# Patient Record
Sex: Male | Born: 1960 | Race: White | Hispanic: No | Smoking: Current every day smoker
Health system: Southern US, Community
[De-identification: ages and names within clinical notes are randomized; demographics above are authoritative.]

---

## 2005-03-27 ENCOUNTER — Emergency Department (HOSPITAL_COMMUNITY): Admission: EM | Admit: 2005-03-27 | Discharge: 2005-03-27 | Payer: Self-pay | Admitting: Emergency Medicine

## 2005-09-12 ENCOUNTER — Emergency Department (HOSPITAL_COMMUNITY): Admission: EM | Admit: 2005-09-12 | Discharge: 2005-09-12 | Payer: Self-pay | Admitting: Emergency Medicine

## 2005-09-14 ENCOUNTER — Emergency Department (HOSPITAL_COMMUNITY): Admission: EM | Admit: 2005-09-14 | Discharge: 2005-09-14 | Payer: Self-pay | Admitting: Emergency Medicine

## 2006-04-21 ENCOUNTER — Emergency Department (HOSPITAL_COMMUNITY): Admission: EM | Admit: 2006-04-21 | Discharge: 2006-04-22 | Payer: Self-pay | Admitting: Emergency Medicine

## 2007-09-11 ENCOUNTER — Emergency Department (HOSPITAL_COMMUNITY): Admission: EM | Admit: 2007-09-11 | Discharge: 2007-09-11 | Payer: Self-pay | Admitting: Emergency Medicine

## 2007-09-16 ENCOUNTER — Emergency Department (HOSPITAL_COMMUNITY): Admission: EM | Admit: 2007-09-16 | Discharge: 2007-09-16 | Payer: Self-pay | Admitting: Emergency Medicine

## 2007-10-07 ENCOUNTER — Emergency Department (HOSPITAL_COMMUNITY): Admission: EM | Admit: 2007-10-07 | Discharge: 2007-10-07 | Payer: Self-pay | Admitting: Emergency Medicine

## 2008-01-16 ENCOUNTER — Emergency Department (HOSPITAL_COMMUNITY): Admission: EM | Admit: 2008-01-16 | Discharge: 2008-01-16 | Payer: Self-pay | Admitting: Emergency Medicine

## 2008-02-27 ENCOUNTER — Emergency Department (HOSPITAL_COMMUNITY): Admission: EM | Admit: 2008-02-27 | Discharge: 2008-02-27 | Payer: Self-pay | Admitting: Emergency Medicine

## 2008-03-04 ENCOUNTER — Emergency Department (HOSPITAL_COMMUNITY): Admission: EM | Admit: 2008-03-04 | Discharge: 2008-03-04 | Payer: Self-pay | Admitting: Emergency Medicine

## 2008-06-09 IMAGING — CR DG SCAPULA*L*
3 series · 3 of 3 positions shown · non-contrast
Comparison: None.

CLINICAL DATA: Blunt trauma ? left shoulder pain.
 LEFT SHOULDER ? 3 VIEW:

[w scapula ap/pa left (1 of 2)]
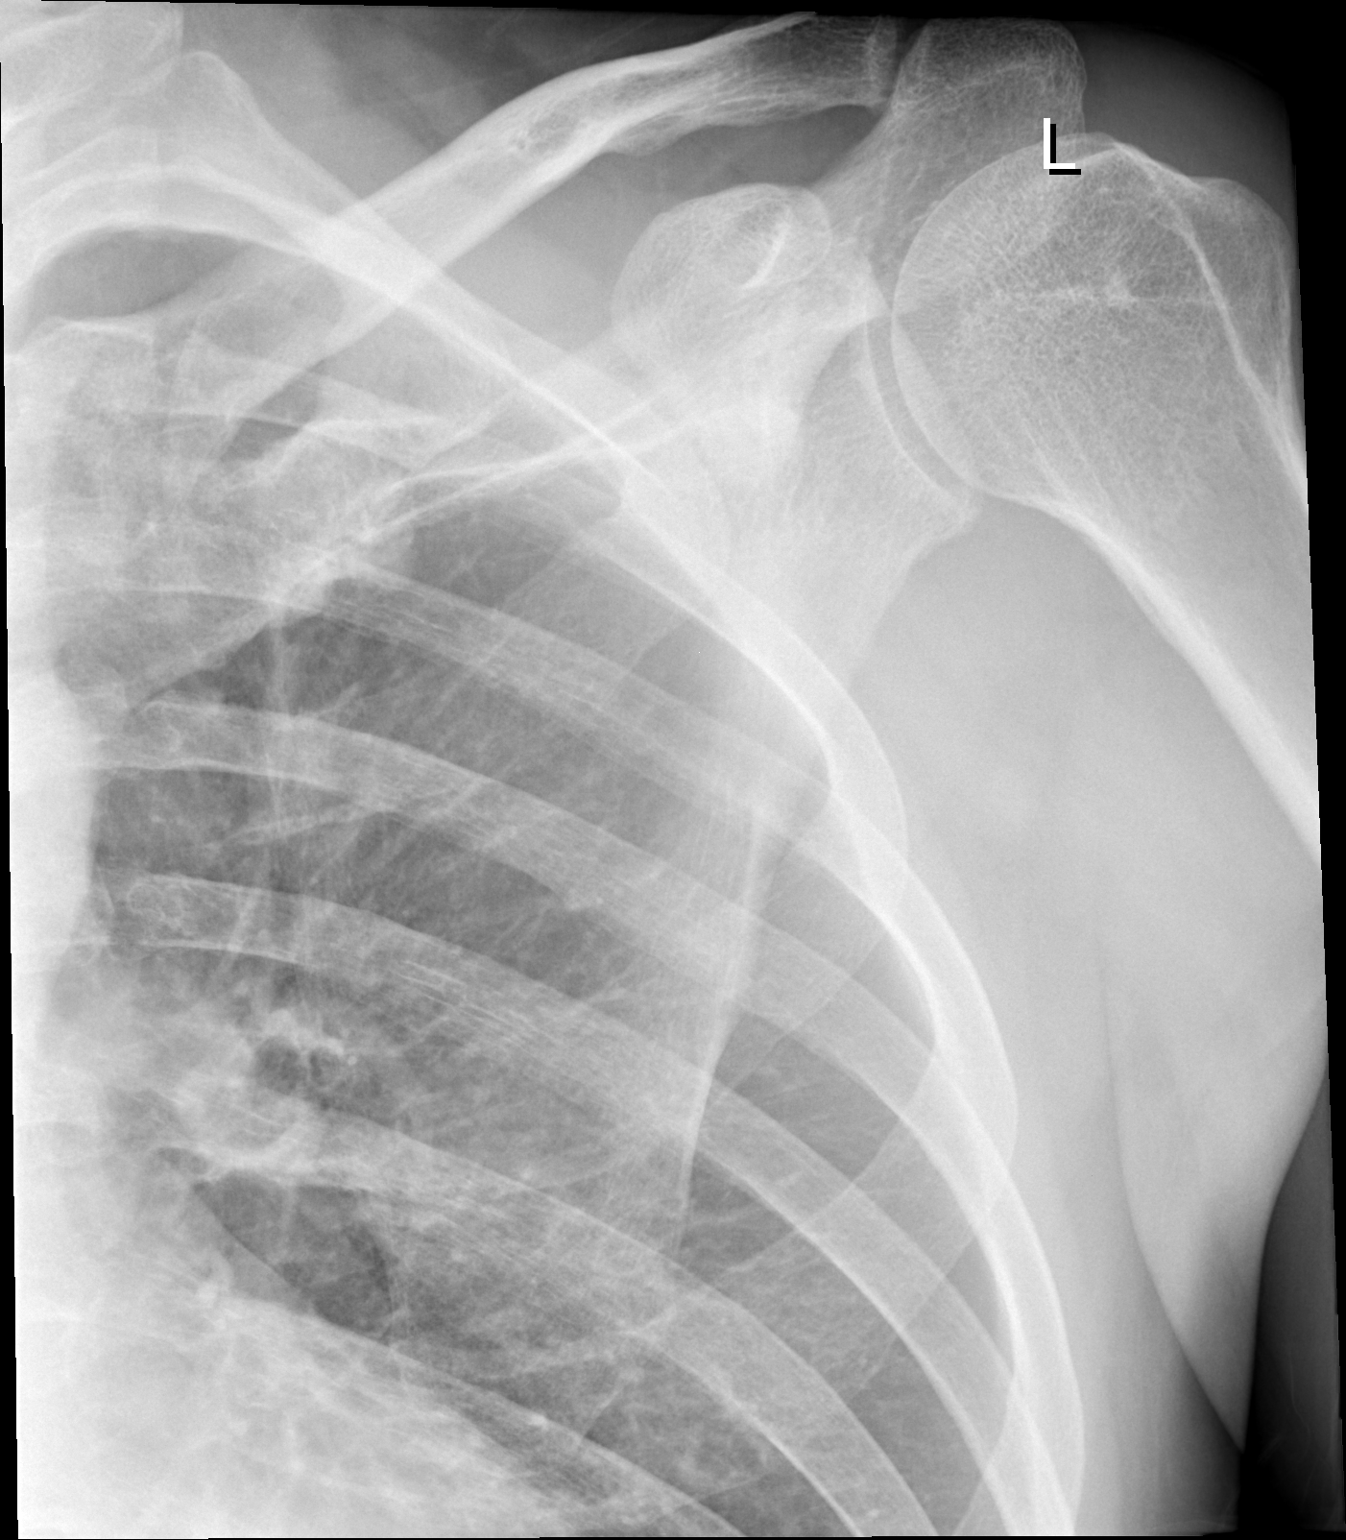

[w scapula ap/pa left (2 of 2)]
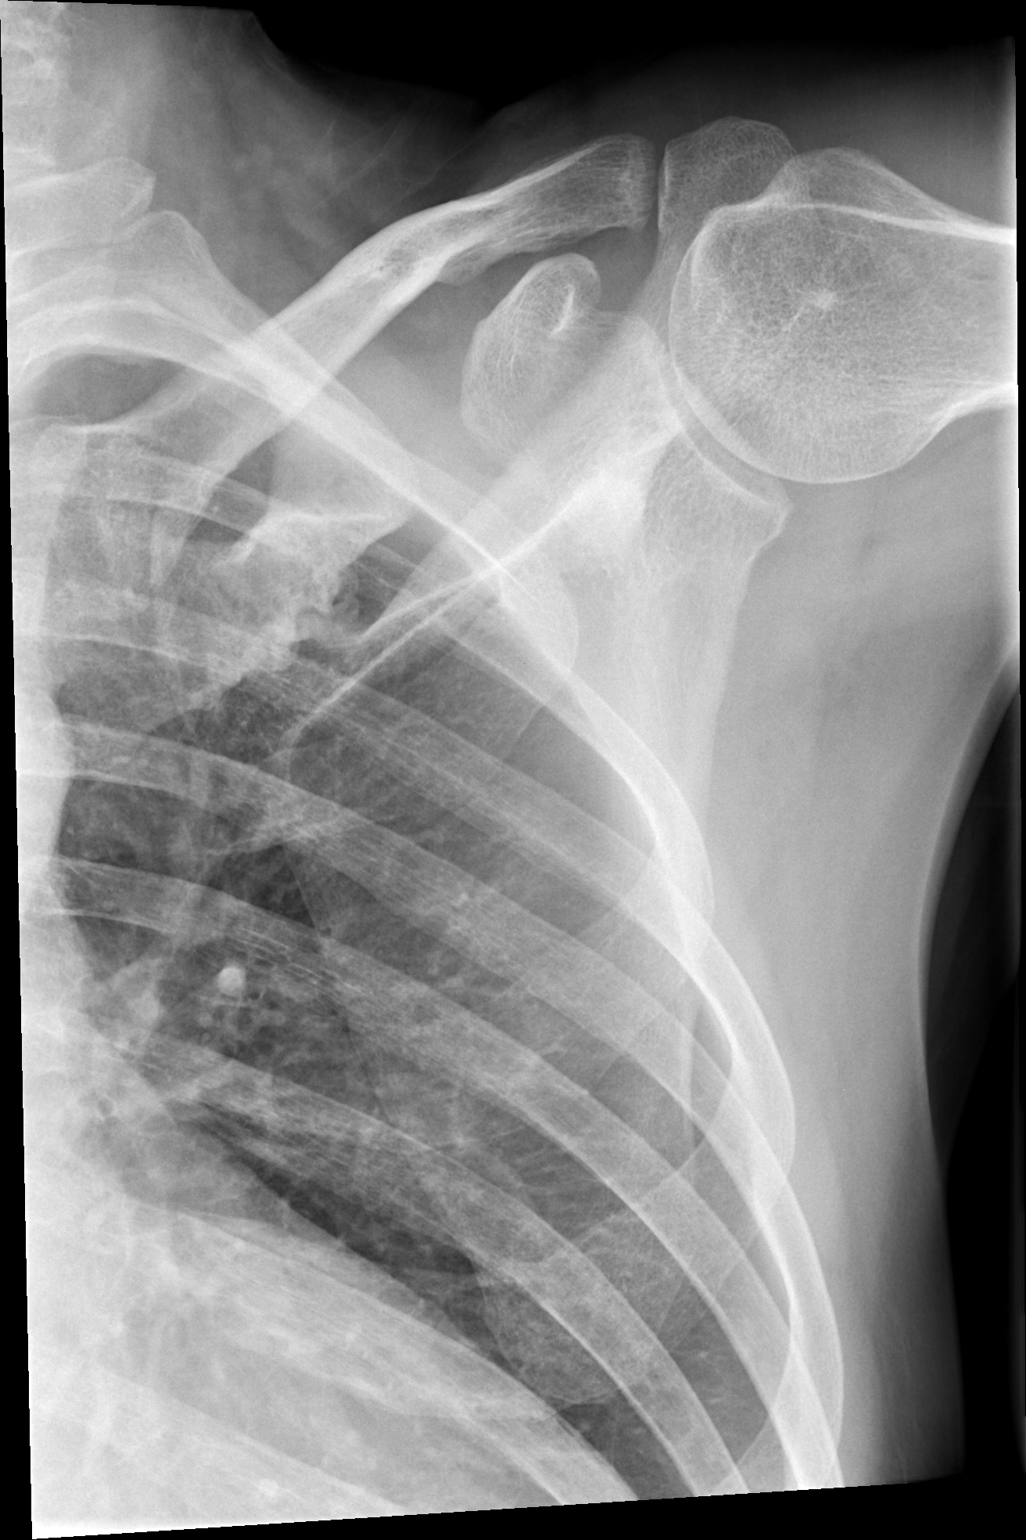

[w scapula lat left *]
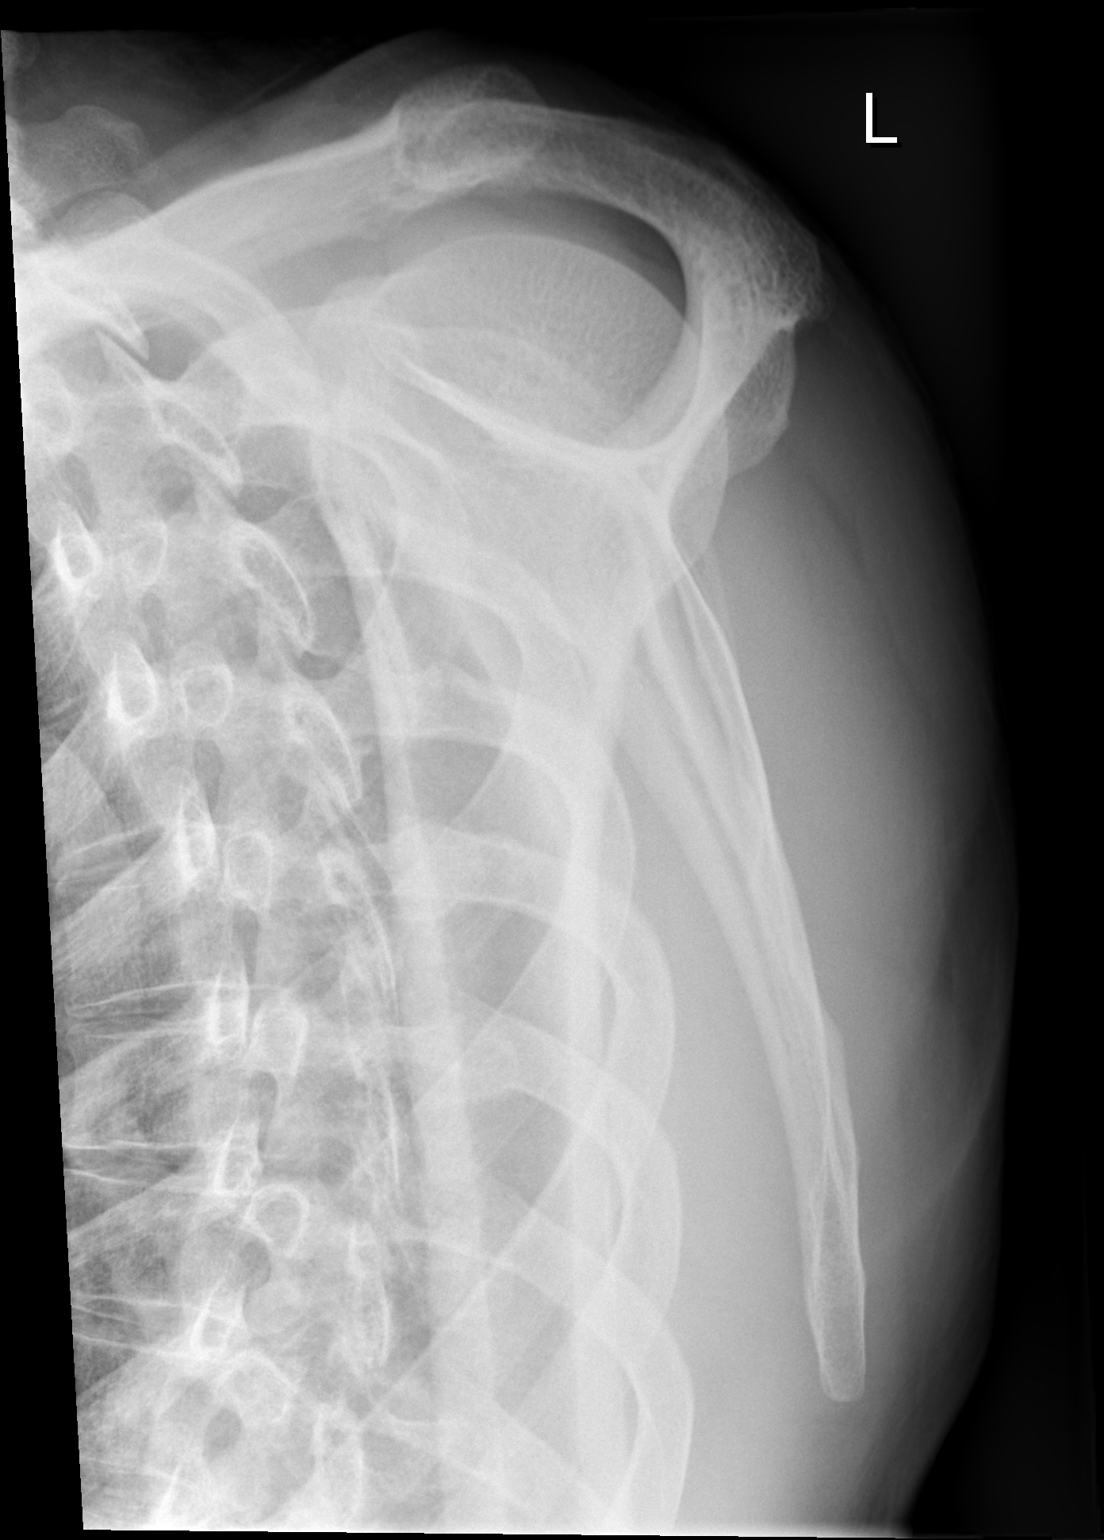

[3 of 3 positions shown; findings below may reference images not displayed]

FINDINGS: No acute radiographic abnormality.  There are mild degenerative changes of the AC joint.    Soft tissues unremarkable.
IMPRESSION: No acute findings.

## 2011-05-12 LAB — URINALYSIS, ROUTINE W REFLEX MICROSCOPIC
Bilirubin Urine: NEGATIVE
Nitrite: NEGATIVE
Specific Gravity, Urine: 1.012
Urobilinogen, UA: 0.2

## 2011-05-12 LAB — URINE MICROSCOPIC-ADD ON

## 2021-05-31 ENCOUNTER — Other Ambulatory Visit: Payer: Self-pay

## 2021-05-31 ENCOUNTER — Emergency Department (HOSPITAL_COMMUNITY)
Admission: EM | Admit: 2021-05-31 | Discharge: 2021-06-02 | Disposition: A | Discharge status: Home / Self Care | Payer: Self-pay | Attending: Emergency Medicine | Admitting: Emergency Medicine

## 2021-05-31 DIAGNOSIS — Z20822 Contact with and (suspected) exposure to covid-19: Secondary | ICD-10-CM | POA: Insufficient documentation

## 2021-05-31 DIAGNOSIS — F10229 Alcohol dependence with intoxication, unspecified: Secondary | ICD-10-CM | POA: Insufficient documentation

## 2021-05-31 DIAGNOSIS — F1914 Other psychoactive substance abuse with psychoactive substance-induced mood disorder: Secondary | ICD-10-CM | POA: Insufficient documentation

## 2021-05-31 DIAGNOSIS — F10230 Alcohol dependence with withdrawal, uncomplicated: Secondary | ICD-10-CM

## 2021-05-31 DIAGNOSIS — F1910 Other psychoactive substance abuse, uncomplicated: Secondary | ICD-10-CM

## 2021-05-31 DIAGNOSIS — R45851 Suicidal ideations: Secondary | ICD-10-CM | POA: Insufficient documentation

## 2021-05-31 DIAGNOSIS — F1994 Other psychoactive substance use, unspecified with psychoactive substance-induced mood disorder: Secondary | ICD-10-CM

## 2021-05-31 LAB — CBC WITH DIFFERENTIAL/PLATELET
Abs Immature Granulocytes: 0.03 10*3/uL (ref 0.00–0.07)
Basophils Absolute: 0 10*3/uL (ref 0.0–0.1)
Basophils Relative: 0 %
Eosinophils Absolute: 0.2 10*3/uL (ref 0.0–0.5)
Eosinophils Relative: 2 %
HCT: 42 % (ref 39.0–52.0)
Hemoglobin: 14 g/dL (ref 13.0–17.0)
Immature Granulocytes: 0 %
Lymphocytes Relative: 13 %
Lymphs Abs: 1.2 10*3/uL (ref 0.7–4.0)
MCH: 30.8 pg (ref 26.0–34.0)
MCHC: 33.3 g/dL (ref 30.0–36.0)
MCV: 92.5 fL (ref 80.0–100.0)
Monocytes Absolute: 0.5 10*3/uL (ref 0.1–1.0)
Monocytes Relative: 5 %
Neutro Abs: 7.3 10*3/uL (ref 1.7–7.7)
Neutrophils Relative %: 80 %
Platelets: 226 10*3/uL (ref 150–400)
RBC: 4.54 MIL/uL (ref 4.22–5.81)
RDW: 11.7 % (ref 11.5–15.5)
WBC: 9.3 10*3/uL (ref 4.0–10.5)
nRBC: 0 % (ref 0.0–0.2)

## 2021-05-31 LAB — COMPREHENSIVE METABOLIC PANEL
ALT: 21 U/L (ref 0–44)
AST: 21 U/L (ref 15–41)
Albumin: 4.5 g/dL (ref 3.5–5.0)
Alkaline Phosphatase: 75 U/L (ref 38–126)
Anion gap: 10 (ref 5–15)
BUN: 14 mg/dL (ref 8–23)
CO2: 28 mmol/L (ref 22–32)
Calcium: 9.8 mg/dL (ref 8.9–10.3)
Chloride: 101 mmol/L (ref 98–111)
Creatinine, Ser: 1.07 mg/dL (ref 0.61–1.24)
GFR, Estimated: >60 mL/min (ref >60)
Glucose, Bld: 104 mg/dL — ABNORMAL HIGH (ref 70–99)
Potassium: 4.4 mmol/L (ref 3.5–5.1)
Sodium: 139 mmol/L (ref 135–145)
Total Bilirubin: 0.8 mg/dL (ref 0.3–1.2)
Total Protein: 7.6 g/dL (ref 6.5–8.1)

## 2021-05-31 LAB — ACETAMINOPHEN LEVEL: Acetaminophen (Tylenol), Serum: <10 ug/mL — ABNORMAL LOW (ref 10–30)

## 2021-05-31 LAB — ETHANOL: Alcohol, Ethyl (B): <10 mg/dL (ref <10)

## 2021-05-31 LAB — LIPASE, BLOOD: Lipase: 28 U/L (ref 11–51)

## 2021-05-31 LAB — RESP PANEL BY RT-PCR (FLU A&B, COVID) ARPGX2
Influenza A by PCR: NEGATIVE
Influenza B by PCR: NEGATIVE
SARS Coronavirus 2 by RT PCR: NEGATIVE

## 2021-05-31 LAB — RAPID URINE DRUG SCREEN, HOSP PERFORMED
Amphetamines: NOT DETECTED
Barbiturates: NOT DETECTED
Benzodiazepines: POSITIVE — AB
Cocaine: NOT DETECTED
Opiates: NOT DETECTED
Tetrahydrocannabinol: NOT DETECTED

## 2021-05-31 LAB — SALICYLATE LEVEL: Salicylate Lvl: <7 mg/dL — ABNORMAL LOW (ref 7.0–30.0)

## 2021-05-31 MED ORDER — THIAMINE HCL 100 MG/ML IJ SOLN: 100.0000 mg | Freq: Every day | INTRAMUSCULAR | Status: DC

## 2021-05-31 MED ORDER — LORAZEPAM 1 MG PO TABS: 0.0000 mg | ORAL_TABLET | Freq: Two times a day (BID) | ORAL | Status: DC

## 2021-05-31 MED ORDER — THIAMINE HCL 100 MG PO TABS
100.0000 mg | ORAL_TABLET | Freq: Every day | ORAL | Status: DC
  Administered 2021-05-31 – 2021-06-02 (×3): 100 mg via ORAL
  Filled 2021-05-31 (×2): qty 1

## 2021-05-31 MED ORDER — LORAZEPAM 1 MG PO TABS
0.0000 mg | ORAL_TABLET | Freq: Four times a day (QID) | ORAL | Status: DC
  Administered 2021-05-31 – 2021-06-01 (×3): 1 mg via ORAL
  Administered 2021-06-01: 2 mg via ORAL
  Administered 2021-06-02 (×2): 1 mg via ORAL
  Administered 2021-06-02: 2 mg via ORAL
  Filled 2021-05-31 (×2): qty 1
  Filled 2021-05-31: qty 2
  Filled 2021-05-31 (×2): qty 1
  Filled 2021-05-31 (×2): qty 2
  Filled 2021-05-31: qty 1

## 2021-05-31 MED ORDER — LORAZEPAM 1 MG PO TABS
2.0000 mg | ORAL_TABLET | Freq: Once | ORAL | Status: AC
  Administered 2021-05-31: 2 mg via ORAL
  Filled 2021-05-31: qty 2

## 2021-05-31 MED ORDER — ACETAMINOPHEN 500 MG PO TABS
500.0000 mg | ORAL_TABLET | Freq: Once | ORAL | Status: AC
  Administered 2021-05-31: 500 mg via ORAL
  Filled 2021-05-31: qty 1

## 2021-05-31 MED ORDER — LORAZEPAM 2 MG/ML IJ SOLN: 0.0000 mg | Freq: Two times a day (BID) | INTRAMUSCULAR | Status: DC

## 2021-05-31 MED ORDER — LORAZEPAM 2 MG/ML IJ SOLN: 0.0000 mg | Freq: Four times a day (QID) | INTRAMUSCULAR | Status: DC

## 2021-05-31 NOTE — ED Triage Notes (Signed)
PT c/o withdrawal from ETOH. PT reports last drink was Sunday 10/9. Pt was drinking a fifth a day. Pt requesting to detox.

## 2021-05-31 NOTE — ED Provider Notes (Signed)
Emergency Medicine Provider Triage Evaluation Note  Doye Montilla , a 60 y.o. male  was evaluated in triage.  Pt complains of si and etoh withdrawal. Last drink was 2 days ago. He also uses valium and used that 2 days ago. Endorses si and states he wanted to drive into a bridge pta but his gf convinced him to come her instead. States he feels worthless and has no will to live. Denies prior hx of etoh withdrawal seizures  Review of Systems  Positive: Si, tremors, headaches, nv Negative: Chest pain  Physical Exam  BP (!) 176/106 (BP Location: Left Arm)   Pulse 93   Temp 98.6 F (37 C) (Oral)   Resp 20   SpO2 100%  Gen:   Awake, no distress   Resp:  Normal effort  MSK:   Moves extremities without difficulty  Other:  tremulous  Medical Decision Making  Medically screening exam initiated at 2:58 PM.  Appropriate orders placed.  Field Staniszewski was informed that the remainder of the evaluation will be completed by another provider, this initial triage assessment does not replace that evaluation, and the importance of remaining in the ED until their evaluation is complete.     Karrie Meres, PA-C 05/31/21 1458    Terald Sleeper, MD 05/31/21 1819

## 2021-05-31 NOTE — BH Assessment (Deleted)
Comprehensive Clinical Assessment (CCA) Note  05/31/2021 Phillip Marquez [id number]  Disposition: Nira Conn, NP, recommends overnight observation for safety and stabilization with psych reassessment in the AM. [place], RN, informed of disposition.  The patient demonstrates the following risk factors for suicide: Chronic risk factors for suicide include: psychiatric disorder of depression, substance use disorder, and completed suicide in a family member. Acute risk factors for suicide include: social withdrawal/isolation, loss (financial, interpersonal, professional), and recent discharge from inpatient psychiatry. Protective factors for this patient include: responsibility to others (children, family) and coping skills. Considering these factors, the overall suicide risk at this point appears to be high. Patient is not appropriate for outpatient follow up.  Flowsheet Row ED from 05/31/2021 in [facility] EMERGENCY DEPARTMENT  C-SSRS RISK CATEGORY High Risk      Phillip Marquez is a 60 year old male presenting voluntary to [facility] due to SI with plan to drive [id number]. Patient denied HI and psychosis. Patient was inpatient at [facility] 05/27/21-05/31/21. Patient reported onset was 2 weeks ago. Patient reported stressors/triggers include working and not being able to catch up on his job and his relationship with his girlfriend. Patient reported he was going to drive into wall and his girlfriend brought him to ED. Patient reported worsening depressive symptoms. Patient denied prior suicide attempts and self-harming behaviors. Patient reported drinking a "fifth of liquor" daily. Patient  Chief Complaint:  Chief Complaint  Patient presents with   Alcohol Intoxication   Suicidal   Visit Diagnosis:  Alcohol dependence Major depressive disorder  CCA  Biopsychosocial Patient Reported Schizophrenia/Schizoaffective Diagnosis in Past: No data recorded  Strengths: self-awareness  Mental Health Symptoms Depression:   Worthlessness; Tearfulness; Hopelessness; Fatigue; Sleep (too much or little); Change in energy/activity; Increase/decrease in appetite   Duration of Depressive symptoms:  Duration of Depressive Symptoms: Less than two weeks   Mania:   None   Anxiety:    None   Psychosis:   None   Duration of Psychotic symptoms:    Trauma:   None   Obsessions:   None   Compulsions:   None   Inattention:   None   Hyperactivity/Impulsivity:   None   Oppositional/Defiant Behaviors:   None   Emotional Irregularity:   None   Other Mood/Personality Symptoms:  No data recorded   Mental Status Exam Appearance and self-care  Stature:   Average   Weight:   Average weight   Clothing:   Casual   Grooming:   Normal   Cosmetic use:   None   Posture/gait:   Normal   Motor activity:   Not Remarkable   Sensorium  Attention:   Normal   Concentration:   Normal   Orientation:   X5   Recall/memory:   Normal   Affect and Mood  Affect:   Appropriate; Depressed; Flat; Labile   Mood:   Depressed; Hopeless; Worthless   Relating  Eye contact:   Normal   Facial expression:   Depressed; Sad   Attitude toward examiner:   Cooperative   Thought and Language  Speech flow:  Slow; Soft   Thought content:   Appropriate to Mood and Circumstances   Preoccupation:   None   Hallucinations:   None   Organization:  No data recorded  [employer] of Knowledge:   Average   Intelligence:   Average   Abstraction:   Normal   Judgement:   Dangerous   Reality Testing:   -- Rich Reining)   Insight:   Poor   Decision Making:   Impulsive   Social Functioning  Social Maturity:   -- Rich Reining)   Social Judgement:   Normal   Stress  Stressors:   Work; Relationship   Coping Ability:    Exhausted; Overwhelmed   Skill Deficits:   Self-control; Decision making   Supports:   Friends/Service system; Support needed    Religion: Religion/Spirituality Are You A Religious Person?:  Industrial/product designer)  Leisure/Recreation: Leisure / Recreation Do You Have Hobbies?: Yes Leisure and Hobbies: "working on cars"  Exercise/Diet: Exercise/Diet Do You Exercise?:  (uta) Have You Gained or Lost A Significant Amount of Weight in the Past Six Months?:  (uta) Do You Follow a Special Diet?:  (uta) Do You Have Any Trouble Sleeping?: Yes Explanation of Sleeping Difficulties: "not many hours"  CCA Employment/Education Employment/Work Situation: Employment / Work Situation Employment Situation: Unemployed Why is Patient on Disability: uta How Long has Patient Been on Disability: uta Has Patient ever Been in the [employer]?:  Industrial/product designer)  Education: Education Is Patient Currently Attending School?: No Last Grade Completed: 12 Did You Attend College?:  (uta) Did You Have An Individualized Education Program (IIEP):  Rich Reining) Did You Have Any Difficulty At School?:  Rich Reining) Patient's Education Has Been Impacted by Current Illness:  (uta)  CCA Family/Childhood History Family and Relationship History: Family history Marital status: Divorced Divorced, when?:  uta What types of issues is patient dealing with in the relationship?: uta Additional relationship information: uta Does patient have children?: Yes How many children?: 1 How is patient's relationship with their children?: "very good"  Childhood History:  Childhood History By whom was/is the patient raised?: Mother Did patient suffer any verbal/emotional/physical/sexual abuse as a child?: No Did patient suffer from severe childhood neglect?: No Witnessed domestic violence?: No  Child/Adolescent Assessment:   CCA Substance Use Alcohol/Drug Use: Alcohol / Drug Use Pain Medications: see MAR Prescriptions: see MAR Over the Counter: see  MAR History of alcohol / drug use?: Yes Substance #1 Name of Substance 1: alcohol 1 - Age of First Use: 11 1 - Amount (size/oz): "fifth" 1 - Frequency: daily 1 - Last Use / Amount: 05/29/21   ASAM's:  Six Dimensions of Multidimensional Assessment  Dimension 1:  Acute Intoxication and/or Withdrawal Potential:      Dimension 2:  Biomedical Conditions and Complications:      Dimension 3:  Emotional, Behavioral, or Cognitive Conditions and Complications:     Dimension 4:  Readiness to Change:     Dimension 5:  Relapse, Continued use, or Continued Problem Potential:     Dimension 6:  Recovery/Living Environment:     ASAM Severity Score:    ASAM Recommended Level of Treatment:     Substance use Disorder (SUD)   Recommendations for Services/Supports/Treatments: Recommendations for Services/Supports/Treatments Recommendations For Services/Supports/Treatments: Medication Management, Individual Therapy  Discharge Disposition:   DSM5 Diagnoses: There are no problems to display for this patient.  Referrals to Alternative Service(s): Referred to Alternative Service(s):   Place:   Date:   Time:    Referred to Alternative Service(s):   Place:   Date:   Time:    Referred to Alternative Service(s):   Place:   Date:   Time:    Referred to Alternative Service(s):   Place:   Date:   Time:     Burnetta Sabin, [facility]

## 2021-05-31 NOTE — ED Provider Notes (Signed)
[facility] EMERGENCY DEPARTMENT Provider Note   CSN: [id number] Arrival date & time: 05/31/21  1445     History Chief Complaint  Patient presents with   Alcohol Intoxication    Phillip Marquez is a 60 y.o. male past medical history of alcohol abuse and suicidal ideation presenting today with a complaint of SI and concern for alcohol withdrawal.  Patient reports that he usually drinks 1/5 of vodka every day however he decided he was not going to do this anymore and stopped drinking.  Last drink was Sunday night.  Patient also reports that he buys Valium off the street and averages 50 to 75 mg/day.  Stopped taking Valium as well.  Today he began to experience withdrawal symptoms such as tremors, headache and upset stomach.  These symptoms made him suicidal.  He did have a plan to drive his car off of a bridge.  He voiced this to his girlfriend who convinced him to come to the hospital instead.  Denies homicidal ideation.  Endorses occasional visual hallucination, usually of stars in front of his eyes.  Has never had a withdrawal seizure.  He was admitted for the same thing 4 days ago and was discharged.  States he began drinking upon discharge from the facility.    Alcohol Intoxication Associated symptoms include headaches. Pertinent negatives include no chest pain, no abdominal pain and no shortness of breath.      No past medical history on file.  There are no problems to display for this patient.   No family history on file.     Home Medications Prior to Admission medications   Not on File    Allergies    Patient has no allergy information on record.  Review of Systems   Review of Systems  Constitutional:  Negative for chills and fever.  Respiratory:  Negative for shortness of breath.   Cardiovascular:  Negative for chest pain and palpitations.  Gastrointestinal:  Negative for abdominal pain, nausea and vomiting.  Neurological:  Positive for tremors and  headaches. Negative for dizziness and seizures.  Psychiatric/Behavioral:  Positive for behavioral problems, dysphoric mood, hallucinations and suicidal ideas. The patient is not nervous/anxious.   All other systems reviewed and are negative.  Physical Exam Updated Vital Signs BP (!) 150/95 (BP Location: Right Arm)   Pulse 80   Temp 98.6 F (37 C) (Oral)   Resp 16   SpO2 95%   Physical Exam Vitals and nursing note reviewed.  Constitutional:      General: He is not in acute distress.    Appearance: Normal appearance. He is not ill-appearing, toxic-appearing or diaphoretic.  HENT:     Head: Normocephalic and atraumatic.     Mouth/Throat:     Mouth: Mucous membranes are moist.     Pharynx: Oropharynx is clear.  Eyes:     General: No scleral icterus.    Conjunctiva/sclera: Conjunctivae normal.     Pupils: Pupils are equal, round, and reactive to light.  Cardiovascular:     Rate and Rhythm: Normal rate and regular rhythm.  Pulmonary:     Effort: Pulmonary effort is normal. No respiratory distress.     Breath sounds: Normal breath sounds. No wheezing or rales.  Abdominal:     General: Abdomen is flat.     Palpations: Abdomen is soft.     Tenderness: There is no abdominal tenderness.  Skin:    General: Skin is warm and dry.     Findings:  No rash.  Neurological:     Mental Status: He is alert and oriented to person, place, and time.     Gait: Gait normal.  Psychiatric:        Mood and Affect: Mood normal.        Behavior: Behavior normal.    ED Results / Procedures / Treatments   Labs (all labs ordered are listed, but only abnormal results are displayed) Labs Reviewed  ACETAMINOPHEN LEVEL - Abnormal; Notable for the following components:      Result Value   Acetaminophen (Tylenol), Serum <10 (*)    All other components within normal limits  COMPREHENSIVE METABOLIC PANEL - Abnormal; Notable for the following components:   Glucose, Bld 104 (*)    All other components  within normal limits  SALICYLATE LEVEL - Abnormal; Notable for the following components:   Salicylate Lvl <7.0 (*)    All other components within normal limits  RAPID URINE DRUG SCREEN, HOSP PERFORMED - Abnormal; Notable for the following components:   Benzodiazepines POSITIVE (*)    All other components within normal limits  RESP PANEL BY RT-PCR (FLU A&B, COVID) ARPGX2  ETHANOL  LIPASE, BLOOD  CBC WITH DIFFERENTIAL/PLATELET    EKG None  Radiology No results found.  Procedures Procedures   Medications Ordered in ED Medications  LORazepam (ATIVAN) tablet 2 mg (2 mg Oral Given 05/31/21 1554)    ED Course  I have reviewed the triage vital signs and the nursing notes.  Pertinent labs & imaging results that were available during my care of the patient were reviewed by me and considered in my medical decision making (see chart for details).    MDM Rules/Calculators/A&P  Patient was evaluated by me in the hallway.  He was in no acute distress and eating his dinner plate.  Endorsing current SI, with desire to crash his car off of a bridge.  Denies any current withdrawal symptoms due to Ativan given at triage.  When questioned about what happened after he was discharged 4 days ago he reported that he immediately started drinking again.  Purchased Valium at that time.  Patient's UDS is positive for benzos.  Ethanol is 0, as expected.  CMP without abnormality, liver function normal.  At this time the patient is medically clear.  He will await discussion with TTS for ultimate disposition.  Final Clinical Impression(s) / ED Diagnoses Final diagnoses:  Alcohol dependence with uncomplicated withdrawal (HCC)  Suicidal ideation    Rx / DC Orders See psychiatric note for the rest of evaluation and ultimate dispo.    Woodroe Chen 05/31/21 2233    Pricilla Loveless, MD 06/03/21 [phone]

## 2021-05-31 NOTE — BH Assessment (Signed)
Comprehensive Clinical Assessment (CCA) Note  05/31/2021 Phillip Marquez [id number]  Disposition: Nira Conn, NP, recommends overnight observation for safety and stabilization with psych reassessment in the AM. [place], RN, informed of disposition.   The patient demonstrates the following risk factors for suicide: Chronic risk factors for suicide include: psychiatric disorder of depression, substance use disorder, and completed suicide in a family member. Acute risk factors for suicide include: social withdrawal/isolation, loss (financial, interpersonal, professional), and recent discharge from inpatient psychiatry. Protective factors for this patient include: responsibility to others (children, family) and coping skills. Considering these factors, the overall suicide risk at this point appears to be high. Patient is not appropriate for outpatient follow up.   Flowsheet Row ED from 05/31/2021 in [facility] EMERGENCY DEPARTMENT  C-SSRS RISK CATEGORY High Risk         Phillip Marquez is a 60 year old male presenting voluntary to [facility] due to SI with plan to drive [id number]. Patient denied HI and psychosis. Patient was inpatient at [facility] 05/27/21-05/31/21. Patient reported onset was 2 weeks ago. Patient reported stressors/triggers include working and not being able to catch up on his job and his relationship with his girlfriend. Patient reported he was going to drive into wall and his girlfriend brought him to ED. Patient reported worsening depressive symptoms. Patient denied prior suicide attempts and self-harming behaviors. Patient reported drinking a "fifth of liquor" daily. Patient denied receiving any outpatient mental health services. Patient denied being prescribed any psych medications. Patient reported he is divorced and resides with girlfriend. Patient reported having 1 side and that they have a good relationship. Patient was calm and cooperative during  assessment.  Chief Complaint:  Chief Complaint  Patient presents with   Alcohol Intoxication   Suicidal   Visit Diagnosis: Major depressive disorder  CCA Biopsychosocial Patient Reported Schizophrenia/Schizoaffective Diagnosis in Past: No data recorded  Strengths: self-awareness  Mental Health Symptoms Depression:   Worthlessness; Tearfulness; Hopelessness; Fatigue; Sleep (too much or little); Change in energy/activity; Increase/decrease in appetite   Duration of Depressive symptoms:  Duration of Depressive Symptoms: Less than two weeks   Mania:   None   Anxiety:    None   Psychosis:   None   Duration of Psychotic symptoms:    Trauma:   None   Obsessions:   None   Compulsions:   None   Inattention:   None   Hyperactivity/Impulsivity:   None   Oppositional/Defiant Behaviors:   None   Emotional Irregularity:   None   Other Mood/Personality Symptoms:  No data recorded   Mental Status Exam Appearance and self-care  Stature:   Average   Weight:   Average weight   Clothing:   Casual   Grooming:   Normal   Cosmetic use:   None   Posture/gait:   Normal   Motor activity:   Not Remarkable   Sensorium  Attention:   Normal   Concentration:   Normal   Orientation:   X5   Recall/memory:   Normal   Affect and Mood  Affect:   Appropriate; Depressed; Flat; Labile   Mood:   Depressed; Hopeless; Worthless   Relating  Eye contact:   Normal   Facial expression:   Depressed; Sad   Attitude toward examiner:   Cooperative   Thought and Language  Speech flow:  Slow; Soft   Thought content:   Appropriate to Mood and Circumstances   Preoccupation:   None  Hallucinations:   None   Organization:  No data recorded  [employer] of Knowledge:   Average   Intelligence:   Average   Abstraction:   Normal   Judgement:   Dangerous   Reality Testing:   -- Rich Reining)   Insight:   Poor   Decision Making:    Impulsive   Social Functioning  Social Maturity:   -- Rich Reining)   Social Judgement:   Normal   Stress  Stressors:   Work; Relationship   Coping Ability:   Exhausted; Overwhelmed   Skill Deficits:   Self-control; Decision making   Supports:   Friends/Service system; Support needed    Religion: Religion/Spirituality Are You A Religious Person?:  Industrial/product designer)  Leisure/Recreation: Leisure / Recreation Do You Have Hobbies?: Yes Leisure and Hobbies: "working on cars"  Exercise/Diet: Exercise/Diet Do You Exercise?:  (uta) Have You Gained or Lost A Significant Amount of Weight in the Past Six Months?:  (uta) Do You Follow a Special Diet?:  (uta) Do You Have Any Trouble Sleeping?: Yes Explanation of Sleeping Difficulties: "not many hours"  CCA Employment/Education Employment/Work Situation: Employment / Work Situation Employment Situation: Unemployed Why is Patient on Disability: uta How Long has Patient Been on Disability: uta Has Patient ever Been in the [employer]?:  Industrial/product designer)  Education: Education Is Patient Currently Attending School?: No Last Grade Completed: 12 Did You Attend College?:  (uta) Did You Have An Individualized Education Program (IIEP):  Rich Reining) Did You Have Any Difficulty At School?:  Rich Reining) Patient's Education Has Been Impacted by Current Illness:  (uta)  CCA Family/Childhood History Family and Relationship History: Family history Marital status: Divorced Divorced, when?: [place] What types of issues is patient dealing with in the relationship?: uta Additional relationship information: uta Does patient have children?: Yes How many children?: 1 How is patient's relationship with their children?: "very good"  Childhood History:  Childhood History By whom was/is the patient raised?: Mother Did patient suffer any verbal/emotional/physical/sexual abuse as a child?: No Did patient suffer from severe childhood neglect?: No Witnessed domestic violence?:  No  Child/Adolescent Assessment:   CCA Substance Use Alcohol/Drug Use: Alcohol / Drug Use Pain Medications: see MAR Prescriptions: see MAR Over the Counter: see MAR History of alcohol / drug use?: Yes Substance #1 Name of Substance 1: alcohol 1 - Age of First Use: 11 1 - Amount (size/oz): "fifth" 1 - Frequency: daily 1 - Last Use / Amount: 05/29/21   ASAM's:  Six Dimensions of Multidimensional Assessment  Dimension 1:  Acute Intoxication and/or Withdrawal Potential:      Dimension 2:  Biomedical Conditions and Complications:      Dimension 3:  Emotional, Behavioral, or Cognitive Conditions and Complications:     Dimension 4:  Readiness to Change:     Dimension 5:  Relapse, Continued use, or Continued Problem Potential:     Dimension 6:  Recovery/Living Environment:     ASAM Severity Score:    ASAM Recommended Level of Treatment:     Substance use Disorder (SUD)   Recommendations for Services/Supports/Treatments: Recommendations for Services/Supports/Treatments Recommendations For Services/Supports/Treatments: Medication Management, Individual Therapy  Discharge Disposition:   DSM5 Diagnoses: There are no problems to display for this patient.  Referrals to Alternative Service(s): Referred to Alternative Service(s):   Place:   Date:   Time:    Referred to Alternative Service(s):   Place:   Date:   Time:    Referred to Alternative Service(s):   Place:   Date:  Time:    Referred to Alternative Service(s):   Place:   Date:   Time:     Burnetta Sabin, [facility]

## 2021-06-01 DIAGNOSIS — F1910 Other psychoactive substance abuse, uncomplicated: Secondary | ICD-10-CM

## 2021-06-01 DIAGNOSIS — F1994 Other psychoactive substance use, unspecified with psychoactive substance-induced mood disorder: Secondary | ICD-10-CM

## 2021-06-01 MED ORDER — DISULFIRAM 250 MG PO TABS
250.0000 mg | ORAL_TABLET | Freq: Every day | ORAL | Status: DC
  Administered 2021-06-01 – 2021-06-02 (×2): 250 mg via ORAL
  Filled 2021-06-01 (×2): qty 1

## 2021-06-01 MED ORDER — TRAZODONE HCL 100 MG PO TABS
100.0000 mg | ORAL_TABLET | Freq: Every day | ORAL | Status: DC
  Administered 2021-06-01: 100 mg via ORAL
  Filled 2021-06-01: qty 1

## 2021-06-01 MED ORDER — DULOXETINE HCL 20 MG PO CPEP
20.0000 mg | ORAL_CAPSULE | Freq: Every day | ORAL | Status: DC
  Administered 2021-06-01 – 2021-06-02 (×2): 20 mg via ORAL
  Filled 2021-06-01 (×2): qty 1

## 2021-06-01 MED ORDER — ACETAMINOPHEN 325 MG PO TABS
325.0000 mg | ORAL_TABLET | ORAL | Status: DC | PRN
  Administered 2021-06-01 – 2021-06-02 (×2): 325 mg via ORAL
  Filled 2021-06-01 (×2): qty 1

## 2021-06-01 MED ORDER — HYDROXYZINE HCL 50 MG PO TABS
50.0000 mg | ORAL_TABLET | Freq: Three times a day (TID) | ORAL | Status: DC | PRN
  Administered 2021-06-01 – 2021-06-02 (×3): 50 mg via ORAL
  Filled 2021-06-01 (×3): qty 1

## 2021-06-01 NOTE — ED Notes (Signed)
Breakfast order placed ?

## 2021-06-01 NOTE — Consult Note (Signed)
Attempted to reach patient for psychiatry reassessment.  Message sent to nurse, awaiting response.

## 2021-06-01 NOTE — ED Notes (Signed)
Verified count of Pt medications with [place], RN. Pt medications with pharmacy.

## 2021-06-01 NOTE — Consult Note (Signed)
Telepsych Consultation   Reason for Consult:  Psychiatric Reassessment Referring Physician:  Cortni Couture PA-C Location of Patient:    [facility] Location of Provider: Other: virtual home office  Patient Identification: Phillip Marquez MRN:  [id number] Principal Diagnosis: Substance induced mood disorder (HCC) Diagnosis:  Principal Problem:   Substance induced mood disorder (HCC) Active Problems:   Polysubstance abuse (HCC)   Total Time spent with patient: 30 minutes  Subjective:   Phillip Marquez is a 60 y.o. male patient admitted with suicidal ideations with plan.  Patient states, "I am old and have nothing to live for."  Patient seen via telepsych by this provider; chart reviewed and consulted with Dr. Lucianne Muss on 06/01/21.  On reassessment Phillip Marquez is withdrawn, and appears sad.  Phillip Marquez endorses hopelessness, suicidal ideations with multiple plans for self harm.  Phillip Marquez's triggered by loneliness, chronic substance usage, and laments over missed life opportunities.  Phillip Marquez is a white middle aged male with substance abuse concerns and cannot name any protective factors against self harm.  Phillip Marquez is a high acute risk for suicide and would benefit from inpatient psychiatric hospitalization where Phillip Marquez can be started on medications, watched for mood stabilization.      HPI:  Per EDP Admission Assessment 05/31/2021@1445  Chief Complaint  Patient presents with   Alcohol Intoxication      Phillip Marquez is a 60 y.o. male past medical history of alcohol abuse and suicidal ideation presenting today with a complaint of SI and concern for alcohol withdrawal.  Patient reports that Phillip Marquez usually drinks 1/5 of vodka every day however Phillip Marquez decided Phillip Marquez was not going to do this anymore and stopped drinking.  Last drink was Sunday night.  Patient also reports that Phillip Marquez buys Valium off the street and averages 50 to 75 mg/day.  Stopped taking Valium as well.  Today Phillip Marquez began to experience withdrawal symptoms such as  tremors, headache and upset stomach.  These symptoms made him suicidal.  Phillip Marquez did have a plan to drive his car off of a bridge.  Phillip Marquez voiced this to his girlfriend who convinced him to come to the hospital instead.  Denies homicidal ideation.  Endorses occasional visual hallucination, usually of stars in front of his eyes.  Has never had a withdrawal seizure.  Phillip Marquez was admitted for the same thing 4 days ago and was discharged.  States Phillip Marquez began drinking upon discharge from the facility.      Past Psychiatric History: alcohol abuse, polysubstance abuse, MDD  Risk to Self:  yes Risk to Others:  no Prior Inpatient Therapy:  yes Prior Outpatient Therapy:  yes  Past Medical History:  no history on file. Family History: No family history on file. Family Psychiatric  History: unknown Social History:  Social History   Substance and Sexual Activity  Alcohol Use Not on file     Social History   Substance and Sexual Activity  Drug Use Not on file    Social History   Socioeconomic History   Marital status: Divorced    Spouse name: Not on file   Number of children: Not on file   Years of education: Not on file   Highest education level: Not on file  Occupational History   Not on file  Tobacco Use   Smoking status: Not on file   Smokeless tobacco: Not on file  Substance and Sexual Activity   Alcohol use: Not on file   Drug use: Not on file   Sexual activity:  Not on file  Other Topics Concern   Not on file  Social History Narrative   Not on file   Social Determinants of Health   Financial Resource Strain: Not on file  Food Insecurity: Not on file  Transportation Needs: Not on file  Physical Activity: Not on file  Stress: Not on file  Social Connections: Not on file   Additional Social History:    Allergies:   Allergies  Allergen Reactions   Quetiapine Fumarate     Other reaction(s): Confusion   Ketorolac Tromethamine Rash   Lactose Intolerance (Gi) Diarrhea    Labs:   Results for orders placed or performed during the hospital encounter of 05/31/21 (from the past 48 hour(s))  Urine rapid drug screen (hosp performed)     Status: Abnormal   Collection Time: 05/31/21  2:58 PM  Result Value Ref Range   Opiates NONE DETECTED NONE DETECTED   Cocaine NONE DETECTED NONE DETECTED   Benzodiazepines POSITIVE (A) NONE DETECTED   Amphetamines NONE DETECTED NONE DETECTED   Tetrahydrocannabinol NONE DETECTED NONE DETECTED   Barbiturates NONE DETECTED NONE DETECTED    Comment: (NOTE) DRUG SCREEN FOR MEDICAL PURPOSES ONLY.  IF CONFIRMATION IS NEEDED FOR ANY PURPOSE, NOTIFY LAB WITHIN 5 DAYS.  LOWEST DETECTABLE LIMITS FOR URINE DRUG SCREEN Drug Class                     Cutoff (ng/mL) Amphetamine and metabolites    1000 Barbiturate and metabolites    200 Benzodiazepine                 200 Tricyclics and metabolites     300 Opiates and metabolites        300 Cocaine and metabolites        300 THC                            50 Performed at [facility], [address] [place], [place] [address]   Resp Panel by RT-PCR (Flu A&B, Covid) Nasopharyngeal Swab     Status: None   Collection Time: 05/31/21  2:59 PM   Specimen: Nasopharyngeal Swab; Nasopharyngeal(NP) swabs in vial transport medium  Result Value Ref Range   SARS Coronavirus 2 by RT PCR NEGATIVE NEGATIVE    Comment: (NOTE) SARS-CoV-2 target nucleic acids are NOT DETECTED.  The SARS-CoV-2 RNA is generally detectable in upper respiratory specimens during the acute phase of infection. The lowest concentration of SARS-CoV-2 viral copies this assay can detect is 138 copies/mL. A negative result does not preclude SARS-Cov-2 infection and should not be used as the sole basis for treatment or other patient management decisions. A negative result may occur with  improper specimen collection/handling, submission of specimen other than nasopharyngeal swab, presence of viral mutation(s) within the areas  targeted by this assay, and inadequate number of viral copies(<138 copies/mL). A negative result must be combined with clinical observations, patient history, and epidemiological information. The expected result is Negative.  Fact Sheet for Patients:  BloggerCourse.com  Fact Sheet for Healthcare Providers:  SeriousBroker.it  This test is no t yet approved or cleared by the [place] FDA and  has been authorized for detection and/or diagnosis of SARS-CoV-2 by FDA under an Emergency Use Authorization (EUA). This EUA will remain  in effect (meaning this test can be used) for the duration of the COVID-19 declaration under Section 564(b)(1) of the Act,  21 U.S.C.section 360bbb-3(b)(1), unless the authorization is terminated  or revoked sooner.       Influenza A by PCR NEGATIVE NEGATIVE   Influenza B by PCR NEGATIVE NEGATIVE    Comment: (NOTE) The Xpert Xpress SARS-CoV-2/FLU/RSV plus assay is intended as an aid in the diagnosis of influenza from Nasopharyngeal swab specimens and should not be used as a sole basis for treatment. Nasal washings and aspirates are unacceptable for Xpert Xpress SARS-CoV-2/FLU/RSV testing.  Fact Sheet for Patients: BloggerCourse.com  Fact Sheet for Healthcare Providers: SeriousBroker.it  This test is not yet approved or cleared by the [place] FDA and has been authorized for detection and/or diagnosis of SARS-CoV-2 by FDA under an Emergency Use Authorization (EUA). This EUA will remain in effect (meaning this test can be used) for the duration of the COVID-19 declaration under Section 564(b)(1) of the Act, 21 U.S.C. section 360bbb-3(b)(1), unless the authorization is terminated or revoked.  Performed at [facility], [address], [place], [place] [address]   Acetaminophen level     Status: Abnormal   Collection Time: 05/31/21  3:04 PM   Result Value Ref Range   Acetaminophen (Tylenol), Serum <10 (L) 10 - 30 ug/mL    Comment: (NOTE) Therapeutic concentrations vary significantly. A range of 10-30 ug/mL  may be an effective concentration for many patients. However, some  are best treated at concentrations outside of this range. Acetaminophen concentrations >150 ug/mL at 4 hours after ingestion  and >50 ug/mL at 12 hours after ingestion are often associated with  toxic reactions.  Performed at [facility], [address], [place], [place] [address]   Comprehensive metabolic panel     Status: Abnormal   Collection Time: 05/31/21  3:04 PM  Result Value Ref Range   Sodium 139 135 - 145 mmol/L   Potassium 4.4 3.5 - 5.1 mmol/L   Chloride 101 98 - 111 mmol/L   CO2 28 22 - 32 mmol/L   Glucose, Bld 104 (H) 70 - 99 mg/dL    Comment: Glucose reference range applies only to samples taken after fasting for at least 8 hours.   BUN 14 8 - 23 mg/dL   Creatinine, Ser 9.52 0.61 - 1.24 mg/dL   Calcium 9.8 8.9 - 84.1 mg/dL   Total Protein 7.6 6.5 - 8.1 g/dL   Albumin 4.5 3.5 - 5.0 g/dL   AST 21 15 - 41 U/L   ALT 21 0 - 44 U/L   Alkaline Phosphatase 75 38 - 126 U/L   Total Bilirubin 0.8 0.3 - 1.2 mg/dL   GFR, Estimated >32 >44 mL/min    Comment: (NOTE) Calculated using the CKD-EPI Creatinine Equation (2021)    Anion gap 10 5 - 15    Comment: Performed at [facility], [address], [place], [place] [address]  Ethanol     Status: None   Collection Time: 05/31/21  3:04 PM  Result Value Ref Range   Alcohol, Ethyl (B) <10 <10 mg/dL    Comment: (NOTE) Lowest detectable limit for serum alcohol is 10 mg/dL.  For medical purposes only. Performed at [facility], [address] [place], [place] [address]   Lipase, blood     Status: None   Collection Time: 05/31/21  3:04 PM  Result Value Ref Range   Lipase 28 11 - 51 U/L    Comment: Performed at [facility], [address] [place], [place] [address]  Salicylate level     Status: Abnormal   Collection Time: 05/31/21  3:04 PM  Result Value Ref Range   Salicylate Lvl <7.0 (L) 7.0 - 30.0 mg/dL    Comment: Performed at [facility], [address] [place], [place] [address]  CBC with Differential     Status: None   Collection Time: 05/31/21  3:04 PM  Result Value Ref Range   WBC 9.3 4.0 - 10.5 K/uL   RBC 4.54 4.22 - 5.81 MIL/uL   Hemoglobin 14.0 13.0 - 17.0 g/dL   HCT 38.1 82.9 - 93.7 %   MCV 92.5 80.0 - 100.0 fL   MCH 30.8 26.0 - 34.0 pg   MCHC 33.3 30.0 - 36.0 g/dL   RDW 16.9 67.8 - 93.8 %   Platelets 226 150 - 400 K/uL   nRBC 0.0 0.0 - 0.2 %   Neutrophils Relative % 80 %   Neutro Abs 7.3 1.7 - 7.7 K/uL   Lymphocytes Relative 13 %   Lymphs Abs 1.2 0.7 - 4.0 K/uL   Monocytes Relative 5 %   Monocytes Absolute 0.5 0.1 - 1.0 K/uL   Eosinophils Relative 2 %   Eosinophils Absolute 0.2 0.0 - 0.5 K/uL   Basophils Relative 0 %   Basophils Absolute 0.0 0.0 - 0.1 K/uL   Immature Granulocytes 0 %   Abs Immature Granulocytes 0.03 0.00 - 0.07 K/uL    Comment: Performed at [facility], [address] [place], [place] [address]    Medications:  Current Facility-Administered Medications  Medication Dose Route Frequency Provider Last Rate Last Admin   acetaminophen (TYLENOL) tablet 325 mg  325 mg Oral Q4H PRN Jacalyn Lefevre, MD   325 mg at 06/01/21 1549   disulfiram (ANTABUSE) tablet 250 mg  250 mg Oral Daily Phillip Marquez E, NP   250 mg at 06/01/21 1549   DULoxetine (CYMBALTA) DR capsule 20 mg  20 mg Oral Daily Phillip Marquez E, NP   20 mg at 06/01/21 1549   hydrOXYzine (ATARAX/VISTARIL) tablet 50 mg  50 mg Oral TID PRN Chales Abrahams, NP   50 mg at 06/01/21 1549   LORazepam (ATIVAN) injection 0-4 mg  0-4 mg Intravenous Q6H Pricilla Loveless, MD       Or   LORazepam (ATIVAN) tablet 0-4 mg  0-4 mg Oral Q6H Pricilla Loveless, MD   1 mg at 06/01/21 1116   [START ON 06/03/2021] LORazepam (ATIVAN) injection 0-4 mg  0-4 mg  Intravenous Q12H Pricilla Loveless, MD       Or   Melene Muller ON 06/03/2021] LORazepam (ATIVAN) tablet 0-4 mg  0-4 mg Oral Q12H Pricilla Loveless, MD       thiamine tablet 100 mg  100 mg Oral Daily Pricilla Loveless, MD   100 mg at 06/01/21 1117   Or   thiamine (B-1) injection 100 mg  100 mg Intravenous Daily Pricilla Loveless, MD       traZODone (DESYREL) tablet 100 mg  100 mg Oral QHS Phillip Marquez E, NP       Current Outpatient Medications  Medication Sig Dispense Refill   acetaminophen (TYLENOL) 500 MG tablet Take 1,000 mg by mouth every 6 (six) hours as needed for mild pain.      Musculoskeletal: Strength & Muscle Tone: within normal limits Gait & Station: normal Patient leans: N/A   Psychiatric Specialty Exam:  Presentation  General Appearance: Fairly Groomed  Eye Contact:Good  Speech:Clear and Coherent; Normal Rate  Speech Volume:Decreased  Handedness:Right   Mood and Affect  Mood:Depressed; Dysphoric  Affect:Congruent; Depressed   Thought Process  Thought Processes:Coherent; Goal Directed  Descriptions of Associations:Intact  Orientation:Full (Time, Place and Person)  Thought Content:Illogical  History of Schizophrenia/Schizoaffective disorder:No data recorded Duration of Psychotic Symptoms:No data recorded Hallucinations:Hallucinations: None  Ideas of Reference:None  Suicidal Thoughts:Suicidal Thoughts: Yes, Active SI Active Intent and/or Plan: With Intent; With Means to Carry Out; With Access to Means  Homicidal Thoughts:Homicidal Thoughts: No   Sensorium  Memory:Immediate Good; Recent Good; Remote Good  Judgment:Impaired  Insight:Lacking   Executive Functions  Concentration:Good  Attention Span:Good  Recall:Good  Fund of Knowledge:Good  Language:Good   Psychomotor Activity  Psychomotor Activity:Psychomotor Activity: Normal   Assets  Assets:Communication Skills   Sleep  Sleep:Sleep: Fair Number of Hours of Sleep: 6    Physical  Exam: Physical Exam Constitutional:      Appearance: Normal appearance.  Cardiovascular:     Rate and Rhythm: Normal rate.     Pulses: Normal pulses.  Pulmonary:     Effort: Pulmonary effort is normal.  Musculoskeletal:     Cervical back: Normal range of motion.  Neurological:     General: No focal deficit present.     Mental Status: Phillip Marquez is alert and oriented to person, place, and time.  Psychiatric:        Attention and Perception: Attention and perception normal.        Mood and Affect: Mood is anxious and depressed. Affect is flat.        Speech: Speech normal.        Behavior: Behavior is withdrawn. Behavior is cooperative.        Thought Content: Thought content includes suicidal ideation. Thought content includes suicidal plan.        Cognition and Memory: Cognition and memory normal.        Judgment: Judgment is impulsive and inappropriate.   Review of Systems  Constitutional: Negative.   HENT: Negative.    Eyes: Negative.   Respiratory: Negative.    Cardiovascular: Negative.   Gastrointestinal: Negative.   Genitourinary: Negative.   Skin: Negative.   Neurological:  Negative for tremors.  Endo/Heme/Allergies: Negative.   Psychiatric/Behavioral:  Positive for depression, substance abuse and suicidal ideas. Negative for hallucinations and memory loss. The patient is nervous/anxious.   Blood pressure (!) 139/93, pulse 66, temperature 98.3 F (36.8 C), temperature source Oral, resp. rate 18, SpO2 100 %. There is no height or weight on file to calculate BMI.  Treatment Plan Summary: White middle aged male with substance abuse concerns,limited protective factors against self harm.  Phillip Marquez is a high acute risk for suicide and would benefit from inpatient psychiatric hospitalization.  Reviewed his labs , Liver function tests , CBC and WBCs which are WNL to restart home psychiatric medications.    Daily contact with patient to assess and evaluate symptoms and progress in treatment and  Medication management.  Above discussed with patient concordance.    Disposition: Recommend psychiatric Inpatient admission when medically cleared.  This service was provided via telemedicine using a 2-way, interactive audio and video technology.  Names of all persons participating in this telemedicine service and their role in this encounter. Name: [name] Role: PMHNP  Name: Phillip Marquez Role: Patient    Chales Abrahams, NP 06/01/2021 3:51 PM

## 2021-06-02 ENCOUNTER — Encounter (HOSPITAL_COMMUNITY): Payer: Self-pay | Admitting: Emergency Medicine

## 2021-06-02 ENCOUNTER — Other Ambulatory Visit: Payer: Self-pay

## 2021-06-02 ENCOUNTER — Inpatient Hospital Stay (HOSPITAL_COMMUNITY)
Admission: AD | Admit: 2021-06-02 | Discharge: 2021-06-14 | DRG: 885 | Disposition: A | Discharge status: Home / Self Care | Payer: Federal, State, Local not specified - Other | Source: Intra-hospital | Attending: Emergency Medicine | Admitting: Emergency Medicine

## 2021-06-02 DIAGNOSIS — F1990 Other psychoactive substance use, unspecified, uncomplicated: Secondary | ICD-10-CM | POA: Diagnosis not present

## 2021-06-02 DIAGNOSIS — G4700 Insomnia, unspecified: Secondary | ICD-10-CM | POA: Diagnosis present

## 2021-06-02 DIAGNOSIS — I429 Cardiomyopathy, unspecified: Secondary | ICD-10-CM | POA: Diagnosis present

## 2021-06-02 DIAGNOSIS — F17210 Nicotine dependence, cigarettes, uncomplicated: Secondary | ICD-10-CM | POA: Diagnosis present

## 2021-06-02 DIAGNOSIS — Z7901 Long term (current) use of anticoagulants: Secondary | ICD-10-CM | POA: Diagnosis not present

## 2021-06-02 DIAGNOSIS — F3130 Bipolar disorder, current episode depressed, mild or moderate severity, unspecified: Principal | ICD-10-CM | POA: Diagnosis present

## 2021-06-02 DIAGNOSIS — R45851 Suicidal ideations: Secondary | ICD-10-CM | POA: Diagnosis present

## 2021-06-02 DIAGNOSIS — Z9151 Personal history of suicidal behavior: Secondary | ICD-10-CM

## 2021-06-02 DIAGNOSIS — Z79899 Other long term (current) drug therapy: Secondary | ICD-10-CM

## 2021-06-02 DIAGNOSIS — F332 Major depressive disorder, recurrent severe without psychotic features: Secondary | ICD-10-CM | POA: Diagnosis present

## 2021-06-02 DIAGNOSIS — F10239 Alcohol dependence with withdrawal, unspecified: Secondary | ICD-10-CM | POA: Diagnosis present

## 2021-06-02 DIAGNOSIS — Z818 Family history of other mental and behavioral disorders: Secondary | ICD-10-CM

## 2021-06-02 DIAGNOSIS — F3160 Bipolar disorder, current episode mixed, unspecified: Secondary | ICD-10-CM

## 2021-06-02 DIAGNOSIS — F1020 Alcohol dependence, uncomplicated: Secondary | ICD-10-CM | POA: Diagnosis not present

## 2021-06-02 DIAGNOSIS — F1320 Sedative, hypnotic or anxiolytic dependence, uncomplicated: Secondary | ICD-10-CM

## 2021-06-02 DIAGNOSIS — F319 Bipolar disorder, unspecified: Secondary | ICD-10-CM

## 2021-06-02 DIAGNOSIS — F419 Anxiety disorder, unspecified: Secondary | ICD-10-CM | POA: Diagnosis present

## 2021-06-02 DIAGNOSIS — F13239 Sedative, hypnotic or anxiolytic dependence with withdrawal, unspecified: Secondary | ICD-10-CM | POA: Diagnosis present

## 2021-06-02 MED ORDER — DULOXETINE HCL 20 MG PO CPEP
20.0000 mg | ORAL_CAPSULE | Freq: Every day | ORAL | Status: DC
  Administered 2021-06-03: 20 mg via ORAL
  Filled 2021-06-02 (×3): qty 1

## 2021-06-02 MED ORDER — LORAZEPAM 1 MG PO TABS: 1.0000 mg | ORAL_TABLET | Freq: Every day | ORAL | Status: DC

## 2021-06-02 MED ORDER — TRAZODONE HCL 100 MG PO TABS
100.0000 mg | ORAL_TABLET | Freq: Every day | ORAL | Status: DC
  Administered 2021-06-03: 100 mg via ORAL
  Filled 2021-06-02 (×4): qty 1

## 2021-06-02 MED ORDER — LORAZEPAM 1 MG PO TABS: 0.0000 mg | ORAL_TABLET | Freq: Two times a day (BID) | ORAL | Status: DC

## 2021-06-02 MED ORDER — THIAMINE HCL 100 MG/ML IJ SOLN: 100.0000 mg | Freq: Every day | INTRAMUSCULAR | Status: DC

## 2021-06-02 MED ORDER — ACETAMINOPHEN 500 MG PO TABS: 1000.0000 mg | ORAL_TABLET | Freq: Four times a day (QID) | ORAL | Status: DC | PRN

## 2021-06-02 MED ORDER — THIAMINE HCL 100 MG PO TABS
100.0000 mg | ORAL_TABLET | Freq: Every day | ORAL | Status: DC
  Administered 2021-06-03 – 2021-06-14 (×3): 100 mg via ORAL
  Filled 2021-06-02 (×14): qty 1

## 2021-06-02 MED ORDER — LORAZEPAM 1 MG PO TABS
1.0000 mg | ORAL_TABLET | Freq: Four times a day (QID) | ORAL | Status: AC
  Administered 2021-06-03 (×3): 1 mg via ORAL
  Filled 2021-06-02 (×3): qty 1

## 2021-06-02 MED ORDER — LORAZEPAM 1 MG PO TABS
1.0000 mg | ORAL_TABLET | Freq: Four times a day (QID) | ORAL | Status: AC | PRN
  Administered 2021-06-05 (×2): 1 mg via ORAL
  Filled 2021-06-02 (×3): qty 1

## 2021-06-02 MED ORDER — DISULFIRAM 250 MG PO TABS
250.0000 mg | ORAL_TABLET | Freq: Every day | ORAL | Status: DC
  Filled 2021-06-02: qty 1

## 2021-06-02 MED ORDER — HYDROXYZINE HCL 25 MG PO TABS
25.0000 mg | ORAL_TABLET | Freq: Four times a day (QID) | ORAL | Status: AC | PRN
  Administered 2021-06-03: 25 mg via ORAL
  Filled 2021-06-02 (×2): qty 1

## 2021-06-02 MED ORDER — HYDROXYZINE HCL 50 MG PO TABS: 50.0000 mg | ORAL_TABLET | Freq: Three times a day (TID) | ORAL | Status: DC | PRN

## 2021-06-02 MED ORDER — LORAZEPAM 1 MG PO TABS: 0.0000 mg | ORAL_TABLET | Freq: Four times a day (QID) | ORAL | Status: DC

## 2021-06-02 MED ORDER — LORAZEPAM 1 MG PO TABS: 1.0000 mg | ORAL_TABLET | Freq: Two times a day (BID) | ORAL | Status: DC

## 2021-06-02 MED ORDER — ADULT MULTIVITAMIN W/MINERALS CH
1.0000 | ORAL_TABLET | Freq: Every day | ORAL | Status: DC
  Administered 2021-06-03 – 2021-06-09 (×3): 1 via ORAL
  Filled 2021-06-02 (×16): qty 1

## 2021-06-02 MED ORDER — LORAZEPAM 2 MG/ML IJ SOLN: 0.0000 mg | Freq: Four times a day (QID) | INTRAMUSCULAR | Status: DC

## 2021-06-02 MED ORDER — LORAZEPAM 2 MG/ML IJ SOLN: 0.0000 mg | Freq: Two times a day (BID) | INTRAMUSCULAR | Status: DC

## 2021-06-02 MED ORDER — ACETAMINOPHEN 325 MG PO TABS
650.0000 mg | ORAL_TABLET | Freq: Four times a day (QID) | ORAL | Status: DC | PRN
  Administered 2021-06-03 – 2021-06-14 (×8): 650 mg via ORAL
  Filled 2021-06-02 (×8): qty 2

## 2021-06-02 MED ORDER — LORAZEPAM 1 MG PO TABS
1.0000 mg | ORAL_TABLET | Freq: Three times a day (TID) | ORAL | Status: DC
  Administered 2021-06-03 – 2021-06-04 (×2): 1 mg via ORAL
  Filled 2021-06-02 (×2): qty 1

## 2021-06-02 MED ORDER — ONDANSETRON 4 MG PO TBDP
4.0000 mg | ORAL_TABLET | Freq: Four times a day (QID) | ORAL | Status: AC | PRN
  Administered 2021-06-03 – 2021-06-04 (×2): 4 mg via ORAL
  Filled 2021-06-02 (×2): qty 1

## 2021-06-02 NOTE — ED Notes (Signed)
Breakfast tray provided. 

## 2021-06-02 NOTE — [facility] Group Notes (Signed)
PT did not attend group. ?

## 2021-06-02 NOTE — Progress Notes (Signed)
Pt accepted to [facility] 307-2    Patient meets inpatient criteria per Ophelia Shoulder, NP   The attending provider will be Dr. Mason Jim  Call report to [phone]    Phillips Grout, RN @ [facility] ED notified.     Pt scheduled  to arrive at [facility] today by 1500.    Damita Dunnings, MSW, LCSW-A  2:27 PM 06/02/2021

## 2021-06-02 NOTE — ED Provider Notes (Signed)
Emergency Medicine Observation Re-evaluation Note  Phillip Marquez is a 60 y.o. male, seen on rounds today.  Pt initially presented to the ED for complaints of Alcohol Intoxication and Suicidal Currently, the patient is awaiting reassessment by TTS and reevaluation.  Physical Exam  BP (!) 138/99   Pulse 70   Temp 97.8 F (36.6 C) (Oral)   Resp 18   SpO2 97%  Physical Exam General: Tremulous and resting Cardiac: No murmur appreciated Lungs: Lungs clear Psych: Resting  ED Course / MDM  EKG:   I have reviewed the labs performed to date as well as medications administered while in observation.  Recent changes in the last 24 hours include got Ativan for withdrawal, still tremulous so we will recheck CIWA and dose medication appropriately.  Plan  Current plan is for TTS reevaluation.  Phillip Marquez is not under involuntary commitment.     Phillip Marquez, Phillip Brim, MD 06/02/21 1056

## 2021-06-02 NOTE — ED Notes (Signed)
Pt got up to go to the bathroom and stated he wasn't feeling very well.  Stated he had the shakes and a headache.  He wasn't shaking quite as badly as 6hrs prior so I gave him 1mg  Ativan and 325mg  of tylenol.

## 2021-06-02 NOTE — Progress Notes (Addendum)
Patient ID: Phillip Marquez, male   DOB: Jan 08, 1961, 60 y.o.   MRN: [id number] Patient admitted under voluntary status from [facility]. Patient reports alcohol abuse for "so many years", states that he drinks a fifth of Vodka every day, and uses 50 to 75mg  of Valium obtained from the streets every day. Pt was questioned multiple times about the amount of  Valium which he alleges that he uses and was adamant that he uses 50-75mg .  Pt also reports daily marijuana use.  Pt reports a history of MDD and anxiety, reports that he was dropped off at the [facility] by his live in girlfriend. Pt endorses +SI with a plan to run over a bridge with his car, and endorses +AH of voices telling him to kill himself and +VH of stars. Pt lists his stressors as relationship issues with his girlfriend, financial issues and the lack of a support system.  Pt denies any history of physical/emotional/sexual abuse.  Patient verbally contracts for safety on the unit, Q15 minute checks for safety initiated, will give report on pt to oncoming shift.  Belongings and person searched as per protocol, skin is intact with tattoo to right upper shoulder. Pt placed on high fall risk protocol due to being on detox protocol and weakness to b/l Les.

## 2021-06-02 NOTE — ED Provider Notes (Signed)
Emergency Medicine Observation Re-evaluation Note  Jayveion Stalling is a 60 y.o. male, seen on rounds today.  Pt initially presented to the ED for complaints of Alcohol Intoxication and Suicidal Currently, the patient is  calm and alert.  Physical Exam  BP 135/85   Pulse 71   Temp 98 F (36.7 C)   Resp 18   SpO2 98%  Physical Exam General: calm, nad Cardiac: regular rate Lungs: breathing comfortably Psych: alert, content. Depressed mood.   ED Course / MDM   I have reviewed the labs performed to date as well as medications administered while in observation.  Recent changes in the last 24 hours include ED obs and bh reassessment.   Plan  BH team accepts to inpatient psych at [facility] Dr Mason Jim.  Pt currently appears stable for movement/admission to [facility].     Cathren Laine, MD 06/02/21 [phone]

## 2021-06-02 NOTE — ED Notes (Signed)
Pt's 4 meds were retrieved from pharmacy and signed for, pt's phone was retrieved from security and signed for, pt's bags retrieved from locker and signed for by pt. Ambulated with pt to safe transfer vehicle in ambulance bay where pt and pt's belongings transferred to [facility].

## 2021-06-03 ENCOUNTER — Encounter (HOSPITAL_COMMUNITY): Payer: Self-pay

## 2021-06-03 ENCOUNTER — Encounter (HOSPITAL_COMMUNITY): Payer: Self-pay | Admitting: Adult Health

## 2021-06-03 DIAGNOSIS — F3160 Bipolar disorder, current episode mixed, unspecified: Secondary | ICD-10-CM | POA: Diagnosis not present

## 2021-06-03 DIAGNOSIS — F319 Bipolar disorder, unspecified: Secondary | ICD-10-CM

## 2021-06-03 DIAGNOSIS — F1320 Sedative, hypnotic or anxiolytic dependence, uncomplicated: Secondary | ICD-10-CM

## 2021-06-03 DIAGNOSIS — F1020 Alcohol dependence, uncomplicated: Secondary | ICD-10-CM

## 2021-06-03 MED ORDER — HALOPERIDOL LACTATE 5 MG/ML IJ SOLN
INTRAMUSCULAR | Status: AC
  Administered 2021-06-03: 5 mg via INTRAMUSCULAR
  Filled 2021-06-03: qty 1

## 2021-06-03 MED ORDER — FLUOXETINE HCL 10 MG PO CAPS
10.0000 mg | ORAL_CAPSULE | Freq: Every day | ORAL | Status: DC
Start: 2021-06-03 — End: 2021-06-04
  Administered 2021-06-03 – 2021-06-04 (×2): 10 mg via ORAL
  Filled 2021-06-03 (×5): qty 1

## 2021-06-03 MED ORDER — NICOTINE 14 MG/24HR TD PT24
14.0000 mg | MEDICATED_PATCH | Freq: Every day | TRANSDERMAL | Status: DC
  Filled 2021-06-03 (×14): qty 1

## 2021-06-03 MED ORDER — TRAZODONE HCL 50 MG PO TABS
50.0000 mg | ORAL_TABLET | Freq: Every evening | ORAL | Status: DC | PRN
  Administered 2021-06-04 – 2021-06-05 (×2): 50 mg via ORAL
  Filled 2021-06-03 (×2): qty 1

## 2021-06-03 MED ORDER — OLANZAPINE 5 MG PO TABS
5.0000 mg | ORAL_TABLET | Freq: Every day | ORAL | Status: DC
  Administered 2021-06-03: 5 mg via ORAL
  Filled 2021-06-03 (×3): qty 1

## 2021-06-03 MED ORDER — HALOPERIDOL 5 MG PO TABS
5.0000 mg | ORAL_TABLET | Freq: Four times a day (QID) | ORAL | Status: DC | PRN
  Administered 2021-06-05 (×2): 5 mg via ORAL
  Filled 2021-06-03 (×2): qty 1

## 2021-06-03 MED ORDER — HALOPERIDOL LACTATE 5 MG/ML IJ SOLN: 5.0000 mg | Freq: Four times a day (QID) | INTRAMUSCULAR | Status: DC | PRN

## 2021-06-03 NOTE — Plan of Care (Signed)
  Problem: Education: Goal: Knowledge of [facility] Education information/materials will improve Outcome: Progressing Goal: Emotional status will improve Outcome: Progressing Goal: Mental status will improve Outcome: Progressing Goal: Verbalization of understanding the information provided will improve Outcome: Progressing   

## 2021-06-03 NOTE — BH IP Treatment Plan (Signed)
Interdisciplinary Treatment and Diagnostic Plan Update  06/03/2021 Time of Session: 1:50pm Purcell Jungbluth MRN: [id number]  Principal Diagnosis: Bipolar I disorder, most recent episode mixed ([place])  Secondary Diagnoses: Principal Problem:   Bipolar I disorder, most recent episode mixed ([place]) Active Problems:   MDD (major depressive disorder), recurrent severe, without psychosis ([place])   Alcohol use disorder, severe, dependence ([place])   Current Medications:  Current Facility-Administered Medications  Medication Dose Route Frequency Provider Last Rate Last Admin   acetaminophen (TYLENOL) tablet 650 mg  650 mg Oral Q6H PRN Harlow Asa, MD   650 mg at 06/03/21 1305   FLUoxetine (PROZAC) capsule 10 mg  10 mg Oral Daily Nwoko, Herbert Pun I, NP       haloperidol (HALDOL) tablet 5 mg  5 mg Oral Q6H PRN Lindell Spar I, NP       Or   haloperidol lactate (HALDOL) injection 5 mg  5 mg Intramuscular Q6H PRN Nwoko, Agnes I, NP       haloperidol lactate (HALDOL) 5 MG/ML injection            hydrOXYzine (ATARAX/VISTARIL) tablet 25 mg  25 mg Oral Q6H PRN Nelda Marseille, Amy E, MD   25 mg at 06/03/21 0640   LORazepam (ATIVAN) tablet 1 mg  1 mg Oral Q6H PRN Harlow Asa, MD       LORazepam (ATIVAN) tablet 1 mg  1 mg Oral QID Nelda Marseille, Amy E, MD   1 mg at 06/03/21 1305   Followed by   LORazepam (ATIVAN) tablet 1 mg  1 mg Oral TID Harlow Asa, MD       Followed by   Derrill Memo ON 06/04/2021] LORazepam (ATIVAN) tablet 1 mg  1 mg Oral BID Nelda Marseille, Amy E, MD       Followed by   Derrill Memo ON 06/06/2021] LORazepam (ATIVAN) tablet 1 mg  1 mg Oral Daily Nelda Marseille, Amy E, MD       multivitamin with minerals tablet 1 tablet  1 tablet Oral Daily Nelda Marseille, Amy E, MD   1 tablet at 06/03/21 1308   nicotine (NICODERM CQ - dosed in mg/24 hours) patch 14 mg  14 mg Transdermal Daily Lindon Romp A, NP       OLANZapine (ZYPREXA) tablet 5 mg  5 mg Oral QHS Nwoko, Agnes I, NP       ondansetron (ZOFRAN-ODT) disintegrating  tablet 4 mg  4 mg Oral Q6H PRN Nelda Marseille, Amy E, MD   4 mg at 06/03/21 6578   thiamine tablet 100 mg  100 mg Oral Daily Merlyn Lot E, NP   100 mg at 06/03/21 4696   traZODone (DESYREL) tablet 100 mg  100 mg Oral QHS Merlyn Lot E, NP   100 mg at 06/03/21 0106   PTA Medications: Medications Prior to Admission  Medication Sig Dispense Refill Last Dose   acetaminophen (TYLENOL) 500 MG tablet Take 1,000 mg by mouth every 6 (six) hours as needed for mild pain.       Patient Stressors:    Patient Strengths:    Treatment Modalities: Medication Management, Group therapy, Case management,  1 to 1 session with clinician, Psychoeducation, Recreational therapy.   Physician Treatment Plan for Primary Diagnosis: Bipolar I disorder, most recent episode mixed ([place]) Long Term Goal(s): Improvement in symptoms so as ready for discharge   Short Term Goals: Ability to identify and develop effective coping behaviors will improve Ability to maintain clinical measurements within normal limits will improve Compliance with prescribed  medications will improve Ability to identify triggers associated with substance abuse/mental health issues will improve Ability to identify changes in lifestyle to reduce recurrence of condition will improve Ability to verbalize feelings will improve Ability to disclose and discuss suicidal ideas Ability to demonstrate self-control will improve  Medication Management: Evaluate patient's response, side effects, and tolerance of medication regimen.  Therapeutic Interventions: 1 to 1 sessions, Unit Group sessions and Medication administration.  Evaluation of Outcomes: Not Met  Physician Treatment Plan for Secondary Diagnosis: Principal Problem:   Bipolar I disorder, most recent episode mixed ([place]) Active Problems:   MDD (major depressive disorder), recurrent severe, without psychosis ([place])   Alcohol use disorder, severe, dependence ([place])  Long Term Goal(s): Improvement  in symptoms so as ready for discharge   Short Term Goals: Ability to identify and develop effective coping behaviors will improve Ability to maintain clinical measurements within normal limits will improve Compliance with prescribed medications will improve Ability to identify triggers associated with substance abuse/mental health issues will improve Ability to identify changes in lifestyle to reduce recurrence of condition will improve Ability to verbalize feelings will improve Ability to disclose and discuss suicidal ideas Ability to demonstrate self-control will improve     Medication Management: Evaluate patient's response, side effects, and tolerance of medication regimen.  Therapeutic Interventions: 1 to 1 sessions, Unit Group sessions and Medication administration.  Evaluation of Outcomes: Not Met   RN Treatment Plan for Primary Diagnosis: Bipolar I disorder, most recent episode mixed ([place]) Long Term Goal(s): Knowledge of disease and therapeutic regimen to maintain health will improve  Short Term Goals: Ability to remain free from injury will improve, Ability to verbalize frustration and anger appropriately will improve, Ability to demonstrate self-control, Ability to participate in decision making will improve, Ability to identify and develop effective coping behaviors will improve, and Compliance with prescribed medications will improve  Medication Management: RN will administer medications as ordered by provider, will assess and evaluate patient's response and provide education to patient for prescribed medication. RN will report any adverse and/or side effects to prescribing provider.  Therapeutic Interventions: 1 on 1 counseling sessions, Psychoeducation, Medication administration, Evaluate responses to treatment, Monitor vital signs and CBGs as ordered, Perform/monitor CIWA, COWS, AIMS and Fall Risk screenings as ordered, Perform wound care treatments as ordered.  Evaluation  of Outcomes: Not Met   LCSW Treatment Plan for Primary Diagnosis: Bipolar I disorder, most recent episode mixed ([place]) Long Term Goal(s): Safe transition to appropriate next level of care at discharge, Engage patient in therapeutic group addressing interpersonal concerns.  Short Term Goals: Engage patient in aftercare planning with referrals and resources, Increase social support, Increase ability to appropriately verbalize feelings, Facilitate acceptance of mental health diagnosis and concerns, Facilitate patient progression through stages of change regarding substance use diagnoses and concerns, Identify triggers associated with mental health/substance abuse issues, and Increase skills for wellness and recovery  Therapeutic Interventions: Assess for all discharge needs, 1 to 1 time with Social worker, Explore available resources and support systems, Assess for adequacy in community support network, Educate family and significant other(s) on suicide prevention, Complete Psychosocial Assessment, Interpersonal group therapy.  Evaluation of Outcomes: Not Met   Progress in Treatment: Attending groups: No. Participating in groups: No. Taking medication as prescribed: Yes. Toleration medication: Yes. Family/Significant other contact made: No, will contact:  if consent is provided Patient understands diagnosis: Yes. Discussing patient identified problems/goals with staff: Yes. Medical problems stabilized or resolved: No. Denies suicidal/homicidal ideation: Yes. Issues/concerns  per patient self-inventory: No.  New problem(s) identified: No, Describe:  none  New Short Term/Long Term Goal(s): detox, medication management for mood stabilization; elimination of SI thoughts; development of comprehensive mental wellness/sobriety plan  Patient Goals:  "To get off of alcohol and xanax"  Discharge Plan or Barriers: Patient recently admitted. CSW will continue to follow and assess for appropriate  referrals and possible discharge planning.     Reason for Continuation of Hospitalization: Depression Medication stabilization Suicidal ideation Withdrawal symptoms  Estimated Length of Stay: 3-5 days   Scribe for Treatment Team: Vassie Moselle, LCSW 06/03/2021 2:33 PM

## 2021-06-03 NOTE — [facility] Group Notes (Signed)
The focus of this group is to help patients establish daily goals to achieve during treatment and discuss how the patient can incorporate goal setting into their daily lives to aide in recovery.  Pt did not attend morning goals and orientation group.  

## 2021-06-03 NOTE — Progress Notes (Signed)
NUTRITION ASSESSMENT RD working remotely.  Pt identified as at risk on the Malnutrition Screen Tool  INTERVENTION: - 1 tablet multivitamin with minerals/day. [facility] staff to continue to encourage PO intakes of meals and snacks.   NUTRITION DIAGNOSIS: Unintentional weight loss related to sub-optimal intake as evidenced by pt report.   Goal: Pt to meet >/= 90% of their estimated nutrition needs.  Monitor:  PO intake  Assessment:  Patient was admitted for alcohol abuse and SI.   Weight yesterday was documented as 155 lb and appears to possibly be a stated weight. PTA the most recently documented weight was 161 lb on 02/10/21 at [facility].   This would indicate a 6 lb weight loss (3.7% body weight) in the past 4 months; not significant for time frame.   60 y.o. male  Height: Ht Readings from Last 1 Encounters:  06/02/21 6' (1.829 m)    Weight: Wt Readings from Last 1 Encounters:  06/02/21 70.3 kg    Weight Hx: Wt Readings from Last 10 Encounters:  06/02/21 70.3 kg    BMI:  Body mass index is 21.02 kg/m. Pt meets criteria for normal weight based on current BMI.  Estimated Nutritional Needs: Kcal: 25-30 kcal/kg Protein: > 1 gram protein/kg Fluid: 1 ml/kcal  Diet Order:  Diet Order             Diet regular Room service appropriate? Yes; Fluid consistency: Thin  Diet effective now                  Pt is also offered choice of unit snacks mid-morning and mid-afternoon.  Pt is eating as desired.   Lab results and medications reviewed.      Trenton Gammon, MS, RD, LDN, CNSC Inpatient Clinical Dietitian RD pager # available in [facility]  After hours/weekend pager # available in [facility]

## 2021-06-03 NOTE — Group Note (Signed)
LCSW Group Therapy Note   Group Date: 06/03/2021 Start Time: 1300 End Time: 1400   Type of Therapy and Topic:  Group Therapy:   Participation Level:  Did Not Attend  Summary of Progress/Problems: Did not attend   Aram Beecham, LCSWA 06/03/2021  1:55 PM

## 2021-06-03 NOTE — Progress Notes (Signed)
Psychoeducational Group Note  Date:  06/03/2021 Time:  2000  Group Topic/Focus:  Evening AA Meeting  Participation Level: Did Not Attend  Participation Quality:  Not Applicable  Affect:  Not Applicable  Cognitive:  Not Applicable  Insight:  Not Applicable  Engagement in Group: Not Applicable  Additional Comments:  Did not attend.   Marcille Buffy 06/03/2021, 9:59 PM

## 2021-06-03 NOTE — H&P (Signed)
Psychiatric Admission Assessment Adult  Patient Identification: Phillip Marquez  MRN:  [id number]  Date of Evaluation:  06/03/2021  Chief Complaint:    Principal Diagnosis: Bipolar I disorder, most recent episode mixed (HCC)  Diagnosis:  Principal Problem:   Bipolar I disorder, most recent episode mixed (HCC) Active Problems:   MDD (major depressive disorder), recurrent severe, without psychosis (HCC)   Alcohol use disorder, severe, dependence (HCC)  History of Present Illness: This is the first psychiatric admission in this [facility] for this 60 year old Caucasian male with hx of Bipolar disorder, alcohol use disorder, 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 He has been a regular patient in another psychiatric hospital x multiple times all related to substance use & bipolar disorder symptoms. Chart is reviewed, the current lab results reviewed & the chart findings discussed with the treatment team. Phillip Marquez is admitted to the [facility] from the [facility] with complaints of worsening alcohol use, benzodiazepine use & suicidal ideations with plans to drive his car into a bridge. After evaluation at the ED, he was recommended for inpatient psychiatric admission for further mental health evaluation/detoxification treatments. His UDS was positive for benzodiazepine & has a BAL of <10. During this evaluation, Phillip Marquez reports,   "A friend took me to the hospital on Tuesday. I went in for detox. I have been using too much Valium & drinking alcohol heavily with it. I have been doing this for a long time. I was buying the valium pills from a friend. I was using 50 to 100 mg of Valium daily to keep the racing thoughts down. I was drinking a fifth of liquor daily. The last time I used/drank was last Sunday. I was diagnosed with bipolar disorder in 2014 by a psychiatrist at the [facility] clinic in [place]. I was put on Depakote & Lamictal. I can't tell you the doses of these medicines.I have not been  on the Depakote or Lamictal in 3 months. I stopped them because I was feeling good & also the Depakote was causing my hair to fall out. I lost quite a lot of hair. So after I stopped the medicines, I resumed drinking heavily again. I have been depressed for a long time. I was also having thoughts of hurting myself. The suicidal thoughts started a month ago. I was going to drive my car into a bridge. I had attempted suicide once a long time ago by carbon monoxide poisoning. A neighbor saw me & stopped me in time. I'm currently feeling very depressed, I'm shaking real bad, have bad anxiety symptoms & still feeling suicidal. I have bad headaches, I feel nauseated & I keep hearing this voice in my head telling me ways to hurt myself. I have tried & used all kinds of drugs out there over the time, but lately, it has been the alcohol & the valium. I sleep very poorly at night. I have lost 15 pounds in 3 months & my appetite is not good. I get mood swings several times in a day. Bipolar runs in my family. I really do need help".  Objective: Patient presents thin, frail-looking, tremulous & weak. However, he presents as a good historian, has a good insight & is agreeable to treatment plan. Vital signs reviewed (stable). Denies any hx of other medical conditions.  Associated Signs/Symptoms:  Depression Symptoms:  depressed mood, insomnia, feelings of worthlessness/guilt, suicidal thoughts with specific plan, anxiety, weight loss,  Duration of Depression Symptoms: Less than two weeks  (Hypo) Manic  Symptoms:  Labiality of Mood, "Auditory hallucinations telling me ways to kill myself"  Anxiety Symptoms:  Excessive Worry,  Psychotic Symptoms:  Hallucinations: Auditory  PTSD Symptoms: Denies any PTSD symptoms or events. NA  Total Time spent with patient: 1 hour  Past Psychiatric History: Yes, Bipolar disorder, alcohol use disorder, cocaine use disorder.  Is the patient at risk to self? Yes.   (Having  fleeting thoughts of suicide) Has the patient been a risk to self in the past 6 months? Yes.    Has the patient been a risk to self within the distant past? Yes.    Is the patient a risk to others? No.  Has the patient been a risk to others in the past 6 months? No.  Has the patient been a risk to others within the distant past? No.   Prior Inpatient Therapy: Yes in other psychiatric hospitals.  Prior Outpatient Therapy: Yes, [facility].  Alcohol Screening: 1. How often do you have a drink containing alcohol?: 4 or more times a week 2. How many drinks containing alcohol do you have on a typical day when you are drinking?: 7, 8, or 9 3. How often do you have six or more drinks on one occasion?: Weekly AUDIT-C Score: 10 4. How often during the last year have you found that you were not able to stop drinking once you had started?: Monthly 5. How often during the last year have you failed to do what was normally expected from you because of drinking?: Weekly 6. How often during the last year have you needed a first drink in the morning to get yourself going after a heavy drinking session?: Monthly 7. How often during the last year have you had a feeling of guilt of remorse after drinking?: Daily or almost daily 8. How often during the last year have you been unable to remember what happened the night before because you had been drinking?: Weekly 9. Have you or someone else been injured as a result of your drinking?: No 10. Has a relative or friend or a doctor or another health worker been concerned about your drinking or suggested you cut down?: No Alcohol Use Disorder Identification Test Final Score (AUDIT): 24 Alcohol Brief Interventions/Follow-up: Alcohol education/Brief advice  Substance Abuse History in the last 12 months:  Yes.    Consequences of Substance Abuse: Discussed witg patient during this admission evaluation. Medical Consequences:  Liver damage, Possible death by  overdose Legal Consequences:  Arrests, jail time, Loss of driving privilege. Family Consequences:  Family discord, divorce and or separation.   Previous Psychotropic Medications: Yes   Psychological Evaluations: No   Past Medical History: History reviewed. No pertinent past medical history. History reviewed. No pertinent surgical history.  Family History: History reviewed. No pertinent family history.  Family Psychiatric  History: Bipolar disorder: Sister.  Tobacco Screening:  Smokes a pack of cigarettes daily  Social History: Divorced, has 1 son, employed, from [place], but lives in [place], [place]. Social History   Substance and Sexual Activity  Alcohol Use None     Social History   Substance and Sexual Activity  Drug Use Not on file    Additional Social History:  Allergies:   Allergies  Allergen Reactions   Quetiapine Fumarate     Other reaction(s): Confusion   Ketorolac Tromethamine Rash   Lactose Intolerance (Gi) Diarrhea   Lab Results: No results found for this or any previous visit (from the past 48 hour(s)).  Blood  Alcohol level:  Lab Results  Component Value Date   ETH <10 05/31/2021   Metabolic Disorder Labs:  No results found for: HGBA1C, MPG No results found for: PROLACTIN No results found for: CHOL, TRIG, HDL, CHOLHDL, VLDL, LDLCALC  Current Medications: Current Facility-Administered Medications  Medication Dose Route Frequency Provider Last Rate Last Admin   acetaminophen (TYLENOL) tablet 650 mg  650 mg Oral Q6H PRN Comer Locket, MD   650 mg at 06/03/21 1305   FLUoxetine (PROZAC) capsule 10 mg  10 mg Oral Daily Adit Riddles, Nicole Kindred I, NP       hydrOXYzine (ATARAX/VISTARIL) tablet 25 mg  25 mg Oral Q6H PRN Mason Jim, Amy E, MD   25 mg at 06/03/21 0640   LORazepam (ATIVAN) tablet 1 mg  1 mg Oral Q6H PRN Comer Locket, MD       LORazepam (ATIVAN) tablet 1 mg  1 mg Oral QID Mason Jim, Amy E, MD   1 mg at 06/03/21 1305   Followed by   LORazepam  (ATIVAN) tablet 1 mg  1 mg Oral TID Comer Locket, MD       Followed by   Melene Muller ON 06/04/2021] LORazepam (ATIVAN) tablet 1 mg  1 mg Oral BID Mason Jim, Amy E, MD       Followed by   Melene Muller ON 06/06/2021] LORazepam (ATIVAN) tablet 1 mg  1 mg Oral Daily Mason Jim, Amy E, MD       multivitamin with minerals tablet 1 tablet  1 tablet Oral Daily Bartholomew Crews E, MD   1 tablet at 06/03/21 7893   nicotine (NICODERM CQ - dosed in mg/24 hours) patch 14 mg  14 mg Transdermal Daily Nira Conn A, NP       OLANZapine (ZYPREXA) tablet 5 mg  5 mg Oral QHS Nickia Boesen I, NP       ondansetron (ZOFRAN-ODT) disintegrating tablet 4 mg  4 mg Oral Q6H PRN Bartholomew Crews E, MD   4 mg at 06/03/21 8101   thiamine tablet 100 mg  100 mg Oral Daily Ophelia Shoulder E, NP   100 mg at 06/03/21 7510   traZODone (DESYREL) tablet 100 mg  100 mg Oral QHS Ophelia Shoulder E, NP   100 mg at 06/03/21 0106   PTA Medications: Medications Prior to Admission  Medication Sig Dispense Refill Last Dose   acetaminophen (TYLENOL) 500 MG tablet Take 1,000 mg by mouth every 6 (six) hours as needed for mild pain.      Musculoskeletal: Strength & Muscle Tone: within normal limits Gait & Station: normal Patient leans: N/A  Psychiatric Specialty Exam:  Presentation  General Appearance: Disheveled  Eye Contact:Fair  Speech:Clear and Coherent; Slow  Speech Volume:Normal  Handedness:Right  Mood and Affect  Mood:Depressed; Anxious  Affect:Depressed; Flat  Thought Process  Thought Processes:Coherent; Goal Directed; Linear  Duration of Psychotic Symptoms: No data recorded Past Diagnosis of Schizophrenia or Psychoactive disorder: No data recorded Descriptions of Associations:Intact  Orientation:Full (Time, Place and Person)  Thought Content:Logical  Hallucinations:Hallucinations: Auditory Description of Auditory Hallucinations: "Voices in my head telling me ways to kill myself". Ideas of Reference:None  Suicidal  Thoughts:Suicidal Thoughts: Yes, Passive SI Active Intent and/or Plan: Without Intent; Without Plan; Without Means to Carry Out; Without Access to Means SI Passive Intent and/or Plan: Without Intent; Without Plan; Without Means to Carry Out; Without Access to Means Homicidal Thoughts:Homicidal Thoughts: No  Sensorium  Memory:Immediate Good; Recent Good; Remote Good  Judgment:Good  Insight:Good  Art therapist  Concentration:Good  Attention Span:Good  Recall:Good  Fund of Knowledge:Good  Language:Good  Psychomotor Activity  Psychomotor Activity: Psychomotor Activity: Tremor  Assets  Assets:Communication Skills; Desire for Improvement; Financial Resources/Insurance; Housing; Physical Health; Resilience; Social Support; Vocational/Educational  Sleep  Sleep: Sleep: Good Number of Hours of Sleep: 6.75  Physical Exam: Physical Exam Vitals and nursing note reviewed.  Constitutional:      Comments: Thin framed, appears frail.  HENT:     Head: Normocephalic.     Nose: Nose normal.     Mouth/Throat:     Pharynx: Oropharynx is clear.  Eyes:     Pupils: Pupils are equal, round, and reactive to light.  Cardiovascular:     Rate and Rhythm: Normal rate.     Pulses: Normal pulses.  Pulmonary:     Effort: Pulmonary effort is normal.  Musculoskeletal:        General: Normal range of motion.     Cervical back: Normal range of motion.  Skin:    General: Skin is warm and dry.  Neurological:     General: No focal deficit present.     Mental Status: He is alert and oriented to person, place, and time.   Review of Systems  Constitutional:  Negative for chills and fever.  HENT:  Negative for congestion and sore throat.   Eyes:  Negative for blurred vision.  Respiratory:  Negative for cough, shortness of breath and wheezing.   Cardiovascular:  Negative for chest pain and palpitations.  Gastrointestinal:  Positive for nausea. Negative for abdominal pain, constipation,  diarrhea, heartburn and vomiting.  Genitourinary:  Negative for dysuria.  Musculoskeletal:  Negative for joint pain and myalgias.  Skin:  Negative for itching and rash.  Neurological:  Positive for tremors and headaches. Negative for dizziness, tingling, sensory change, speech change, focal weakness, seizures, loss of consciousness and weakness.  Endo/Heme/Allergies:        Allergies: Seroquel, Ketorolac.   Food allergy: Lactose intolerance  Psychiatric/Behavioral:  Positive for depression, hallucinations ("Voices telling me to kill myself"), substance abuse (UDS (+) for Benzodiazepine, Hx. polysubstance abuse.) and suicidal ideas (Currently having fleeting thoughts). Negative for memory loss. The patient is nervous/anxious and has insomnia.   Blood pressure 109/61, pulse 64, temperature 98 F (36.7 C), resp. rate 18, height 6' (1.829 m), weight 70.3 kg, SpO2 99 %. Body mass index is 21.02 kg/m.  Treatment Plan Summary: Daily contact with patient to assess and evaluate symptoms and progress in treatment and Medication management.   Treatment Plan/Recommendations: 1. Admit for crisis management and stabilization, estimated length of stay 3-5 days.   2. Medication management to reduce current symptoms to base line and improve the patient's overall level of functioning: See [facility] for plan of care.  Alcohol/benzodiazepine withdrawal symptoms.  Will continue the CIWA detox protocols as already in progress  Bipolar disorder.  Initiated Olanzapine 5 mg po Q hs.  Major depression. Initiated Fluoxetine 10 mg po daily today. Discontinued Duloxetine 20 mg.  Anxiety. Continue Vistaril 25 mg po Q 6 hrs prn.  Insomnia.  Continue Trazodone 50 mg po Q bedtime.  Other prn medication: continue,  Acetaminophen 650 mg po 6 hrs prn for pain/fever. Zofran-ODT 4 mg po Q 6 hrs prn for N/V. Thiamine 100 mg po daily for thiamine replacement. Multivitamin 1 tab po daily for nutritional  supplementation.  Nicotine withdrawal symptoms. Continue nicotine patch 14 mg  transdermally Q 24 hours.  3. Treat health problems as indicated.   4. Develop  treatment plan to decrease risk of relapse upon discharge and the need for readmission.   5. Psycho-social education regarding relapse prevention and self care.   6. Health care follow up as needed for medical problems.  7. Review, reconcile, and reinstate any pertinent home medications for other health issues where appropriate. 8. Call for consults with hospitalist for any additional specialty patient care services as needed.   Observation Level/Precautions:  15 minute checks  Laboratory:   Per ED, reviewed current lab results. Will obtain Hgba1c, Lipid panel.  Psychotherapy: Enrolled in group sessions,  Medications: See MAR for list of medications.  Consultations: As needed.   Discharge Concerns:  Safety, mood stabilization, maintaining sobriety   Estimated LOS: 2-4 days  Other: Admit to the 300-hall    Physician Treatment Plan for Primary Diagnosis: Bipolar I disorder, most recent episode mixed (HCC) Long Term Goal(s): Improvement in symptoms so as ready for discharge  Short Term Goals: Ability to identify changes in lifestyle to reduce recurrence of condition will improve, Ability to verbalize feelings will improve, Ability to disclose and discuss suicidal ideas, and Ability to demonstrate self-control will improve  Physician Treatment Plan for Secondary Diagnosis: Principal Problem:   Bipolar I disorder, most recent episode mixed (HCC) Active Problems:   MDD (major depressive disorder), recurrent severe, without psychosis (HCC)   Alcohol use disorder, severe, dependence (HCC)  Long Term Goal(s): Improvement in symptoms so as ready for discharge  Short Term Goals: Ability to identify and develop effective coping behaviors will improve, Ability to maintain clinical measurements within normal limits will improve,  Compliance with prescribed medications will improve, and Ability to identify triggers associated with substance abuse/mental health issues will improve  I certify that inpatient services furnished can reasonably be expected to improve the patient's condition.    Armandina Stammer, NP, pmhnp, fnp-bc 10/14/20222:03 PM

## 2021-06-03 NOTE — [facility] Suicide Risk Assessment (Addendum)
[facility] Admission Suicide Risk Assessment   Nursing information obtained from:  Patient Demographic factors:  Male, Caucasian Current Mental Status:  Suicidal ideation indicated by patient, Suicide plan Loss Factors:  Decline in physical health Historical Factors:  Family history of mental illness or substance abuse Risk Reduction Factors:  NA  Total Time spent with patient: 45 minutes Principal Problem: Bipolar depression (HCC) Diagnosis:  Principal Problem:   Bipolar depression (HCC) Active Problems:   Alcohol use disorder, severe, dependence (HCC)   Severe benzodiazepine use disorder (HCC)  Subjective Data:  Patient is a 60 year old male with a past psychiatric history of bipolar disorder, alcohol use disorder, and benzodiazepine use disorder, who was admitted to psychiatric hospital with suicide ideation with plan and intent to drive into a bridge.  Patient reports suicidal thoughts started about 1 month ago, and has been increasing in intensity and severity since that time.  Patient also reports worsening depression for about 3 months.  Patient reports stopping psychiatric medications, Depakote and Lamictal, about 3 months ago.  Reports current stressors are: Relationship stressor, alcohol addiction and benzodiazepine addiction. There is reported history of suicide attempt x1 by carbon oxide poisoning in the past.  Patient has history of psychiatric hospitalizations. At this time, the patient reports feeling down depressed, sad, and having anhedonia.  Reports poor sleep.  Poor appetite with weight loss.  Reports having headache and tremor.  Reports last drink of alcohol was 2 to 3 days ago.  Currently on CIWA protocol. Agreeable to medication changes, as listed in the plan section below.   Continued Clinical Symptoms:  Alcohol Use Disorder Identification Test Final Score (AUDIT): 24 The "Alcohol Use Disorders Identification Test", Guidelines for Use in Primary Care, Second Edition.  World  Science writer [facility]). Score between 0-7:  no or low risk or alcohol related problems. Score between 8-15:  moderate risk of alcohol related problems. Score between 16-19:  high risk of alcohol related problems. Score 20 or above:  warrants further diagnostic evaluation for alcohol dependence and treatment.   CLINICAL FACTORS:   Severe Anxiety and/or Agitation Bipolar Disorder:   Depressive phase   Musculoskeletal: Strength & Muscle Tone: within normal limits Gait & Station: Patient laying in bed, did not assess gait or station. Patient leans: Patient laying in bed, did not assess gait or station.  Psychiatric Specialty Exam:  Presentation  General Appearance: Disheveled  Eye Contact:Fleeting  Speech:Normal Rate  Speech Volume:Decreased  Handedness:Right   Mood and Affect  Mood:Anxious; Depressed; Dysphoric; Hopeless  Affect:Congruent; Constricted; Depressed   Thought Process  Thought Processes:Disorganized; Linear  Descriptions of Associations:Intact  Orientation:Full (Time, Place and Person)  Thought Content:Logical  History of Schizophrenia/Schizoaffective disorder:No data recorded Duration of Psychotic Symptoms:No data recorded Hallucinations:Hallucinations: None Description of Auditory Hallucinations: "Voices in my head telling me ways to kill myself".  Ideas of Reference:None  Suicidal Thoughts:Suicidal Thoughts: Yes, Passive SI Active Intent and/or Plan: Without Intent; Without Plan SI Passive Intent and/or Plan: Without Intent; Without Plan; Without Means to Carry Out; Without Access to Means  Homicidal Thoughts:Homicidal Thoughts: No   Sensorium  Memory:Immediate Good; Recent Good; Remote Good  Judgment:Poor  Insight:Poor   Executive Functions  Concentration:Fair  Attention Span:Good  Recall:Good  Fund of Knowledge:Good  Language:Good   Psychomotor Activity  Psychomotor Activity:Psychomotor Activity: Normal   Assets   Assets:Communication Skills; Desire for Improvement; Financial Resources/Insurance; Housing; Physical Health; Resilience; Social Support; Vocational/Educational   Sleep  Sleep:Sleep: Poor Number of Hours of Sleep: 6.75  Physical Exam: Physical Exam see h and p  ROS see h and p  Blood pressure (!) 102/46, pulse (!) 59, temperature 97.7 F (36.5 C), temperature source Oral, resp. rate 18, height 6' (1.829 m), weight 70.3 kg, SpO2 99 %. Body mass index is 21.02 kg/m.   COGNITIVE FEATURES THAT CONTRIBUTE TO RISK:  None    SUICIDE RISK:   Moderate:  Frequent suicidal ideation with limited intensity, and duration, some specificity in terms of plans, no associated intent, good self-control, limited dysphoria/symptomatology, some risk factors present, and identifiable protective factors, including available and accessible social support.  PLAN OF CARE:   ASSESSMENT:  Diagnoses / Active Problems: Bipolar disorder, type.  Current depressive episode. Alcohol use disorder, severe Benzodiazepine use disorder, severe  PLAN: Safety and Monitoring:  -- Voluntary admission to inpatient psychiatric unit for safety, stabilization and treatment  -- Daily contact with patient to assess and evaluate symptoms and progress in treatment  -- Patient's case to be discussed in multi-disciplinary team meeting  -- Observation Level : q15 minute checks  -- Vital signs:  q12 hours  -- Precautions: suicide, elopement, and assault  2. Psychiatric Diagnoses and Treatment:    Stop Cymbalta Start Prozac 10 mg once daily, for depressive symptoms.  Titrate to 20 mg over the weekend. Start Zyprexa 5 mg nightly, for bipolar depression Continue CIWA scheduled Ativan taper plus Ativan as needed Change trazodone from 100 mg at bedtime schedule, to 50 mg nightly as needed Start Haldol protocol for agitation  --  The risks/benefits/side-effects/alternatives to this medication were discussed in detail with  the patient and time was given for questions. The patient consents to medication trial.   -- Metabolic profile and EKG monitoring obtained while on an atypical antipsychotic (BMI: Lipid Panel: HbgA1c: QTc:)   -- Encouraged patient to participate in unit milieu and in scheduled group therapies   -- Short Term Goals: Ability to identify changes in lifestyle to reduce recurrence of condition will improve, Ability to verbalize feelings will improve, Ability to disclose and discuss suicidal ideas, Ability to demonstrate self-control will improve, Ability to identify and develop effective coping behaviors will improve, Ability to maintain clinical measurements within normal limits will improve, Compliance with prescribed medications will improve, and Ability to identify triggers associated with substance abuse/mental health issues will improve  -- Long Term Goals: Improvement in symptoms so as ready for discharge     3. Medical Issues Being Addressed:   Tobacco Use Disorder  -- Nicotine patch 21mg /24 hours ordered  -- Smoking cessation encouraged  4. Discharge Planning:   -- Social work and case management to assist with discharge planning and identification of hospital follow-up needs prior to discharge  -- Estimated LOS: 5-7 days  -- Discharge Concerns: Need to establish a safety plan; Medication compliance and effectiveness  -- Discharge Goals: Return home with outpatient referrals for mental health follow-up including medication management/psychotherapy    Total Time Spent in Direct Patient Care:  I personally spent 60 minutes on the unit in direct patient care. The direct patient care time included face-to-face time with the patient, reviewing the patient's chart, communicating with other professionals, and coordinating care. Greater than 50% of this time was spent in counseling or coordinating care with the patient regarding goals of hospitalization, psycho-education, and discharge planning  needs.   02-07-1984, MD Psychiatrist     I certify that inpatient services furnished can reasonably be expected to improve the patient's condition.   Phineas Inches  Rotha Cassels, MD 06/03/2021, 4:46 PM

## 2021-06-03 NOTE — Progress Notes (Signed)
Progress note  Pt found in bed; allowed to rest. With assessment, pt complained of detox symptoms that included agitation, anxiety, sweating and headache. Pt also complained of a "manic rage" and stated only haldol would help this. Pt was provided prn medication. Pt endorses passive si with no plan. Pt denies hi/ah/vh and verbally agrees to approach staff if these become apparent or before harming themselves/others while at [facility].  A: Pt provided support and encouragement. Pt given medication per protocol and standing orders. Q45m safety checks implemented and continued.  R: Pt safe on the unit. Will continue to monitor.

## 2021-06-03 NOTE — Progress Notes (Signed)
   06/03/21 0121  Psych Admission Type (Psych Patients Only)  Admission Status Voluntary  Psychosocial Assessment  Patient Complaints Substance abuse;Anxiety  Eye Contact Brief  Facial Expression Anxious  Affect Anxious  Speech Soft;Slow  Interaction Assertive  Motor Activity Tremors  Appearance/Hygiene In scrubs;Disheveled  Behavior Characteristics Cooperative  Mood Anxious  Thought Process  Coherency WDL  Content WDL  Delusions None reported or observed  Perception Hallucinations  Hallucination Auditory;Command (hears ringing in his ears and voices telling him to hurt himself)  Judgment Impaired  Confusion None  Danger to Self  Current suicidal ideation? Passive  Self-Injurious Behavior No self-injurious ideation or behavior indicators observed or expressed   Agreement Not to Harm Self Yes  Description of Agreement verbal  Danger to Others  Danger to Others None reported or observed   Pt seen at med window. Did not get up until now because he was sleeping all shift. Pt endorses SI without a plan but contracts for safety. Pt endorses AH saying he "hears ringing in my ears and a faint voice that is telling me to do things." Pt endorses command auditory hallucinations. Says he didn't act on anything they told him to do because, "I know that the voices aren't real." Pt says he has been experiencing AH for the last couple days. Pt is tremulous and walks with care. Endorses a headache that is 10/10. Rates anxiety 8/10.

## 2021-06-04 LAB — LIPID PANEL
Cholesterol: 273 mg/dL — ABNORMAL HIGH (ref 0–200)
HDL: 38 mg/dL — ABNORMAL LOW (ref >40)
LDL Cholesterol: 212 mg/dL — ABNORMAL HIGH (ref 0–99)
Total CHOL/HDL Ratio: 7.2 RATIO
Triglycerides: 116 mg/dL (ref <150)
VLDL: 23 mg/dL (ref 0–40)

## 2021-06-04 LAB — HEMOGLOBIN A1C
Hgb A1c MFr Bld: 4.8 % (ref 4.8–5.6)
Mean Plasma Glucose: 91.06 mg/dL

## 2021-06-04 MED ORDER — OLANZAPINE 10 MG PO TABS
10.0000 mg | ORAL_TABLET | Freq: Every day | ORAL | Status: DC
  Administered 2021-06-04 – 2021-06-09 (×6): 10 mg via ORAL
  Filled 2021-06-04 (×10): qty 1

## 2021-06-04 MED ORDER — FLUOXETINE HCL 20 MG PO CAPS
20.0000 mg | ORAL_CAPSULE | Freq: Every day | ORAL | Status: DC
  Administered 2021-06-05 – 2021-06-06 (×2): 20 mg via ORAL
  Filled 2021-06-04 (×3): qty 1

## 2021-06-04 MED ORDER — LORAZEPAM 1 MG PO TABS
1.0000 mg | ORAL_TABLET | Freq: Three times a day (TID) | ORAL | Status: AC
  Administered 2021-06-04 (×2): 1 mg via ORAL
  Filled 2021-06-04 (×2): qty 1

## 2021-06-04 MED ORDER — LORAZEPAM 1 MG PO TABS
1.0000 mg | ORAL_TABLET | Freq: Every day | ORAL | Status: AC
  Administered 2021-06-06: 1 mg via ORAL
  Filled 2021-06-04: qty 1

## 2021-06-04 MED ORDER — LORAZEPAM 1 MG PO TABS
1.0000 mg | ORAL_TABLET | Freq: Two times a day (BID) | ORAL | Status: AC
  Administered 2021-06-05 (×2): 1 mg via ORAL
  Filled 2021-06-04 (×2): qty 1

## 2021-06-04 NOTE — Progress Notes (Addendum)
[name] MD Progress Note  06/04/2021 3:46 PM Phillip Marquez  MRN:  [id number] Subjective:  "I don't feel good today"   Objective: Patient is a 60 year old Caucasian male with hx of Bipolar disorder, current episode depressed, alcohol use disorder, benzodiazepine use disorder & cocaine use disorder. He has been a regular patient in another psychiatric hospital x multiple times all related to substance use & bipolar disorder symptoms.  Phillip Marquez is admitted to [facility] from the [facility] hospital with complaints of worsening alcohol use, benzodiazepine use & suicidal ideations with a plan to drive his car into a bridge. His UDS was positive for benzodiazepine & has a BAL of <10.   Evaluation on the unit today, 10/15: Patient is seen in his room today. He is lying in his bed after lunch, covers pulled up to his chin, asleep. He is easily aroused but stated "I don't feel good today." He is ill-appearing. He is not very talkative, his eye contact is fair and he appears depressed and sad. He stated he slept poorly last night, record reflects he slept 6.5 hours. He stated he had troubling falling and staying asleep. He did not receive any Trazodone last night, I reminded him that he can have this for sleep and this might help. He is on an ativan taper for alcohol and benzodiazepine withdrawal. He has not had any PRN Ativan per CIWA score , his most recent score was 8 at 1200 today. He is taking his Zyprexa and Prozac and has no issues with them. He stated he has a headache and I encouraged him to ask for some Tylenol. He stated he is still having suicidal thoughts but denies a plan. He denies HI/AVH, paranoia and delusions. He appears depressed, he is not attending group or seen in the therapeutic milieu. He stated he did eat lunch. He is able to contract for safety on the unit. Will continue to monitor for safety and withdrawals.   PRN's in the last 24 hours:  Tylenol, Vistaril.   Principal Problem: Bipolar depression  (HCC) Diagnosis: Principal Problem:   Bipolar depression (HCC) Active Problems:   Alcohol use disorder, severe, dependence (HCC)   Severe benzodiazepine use disorder (HCC)  Total Time spent with patient: 20 minutes  Past Psychiatric History:  Yes, Bipolar disorder, alcohol use disorder, cocaine use disorder.  Past Medical History: History reviewed. No pertinent past medical history. History reviewed. No pertinent surgical history. Family History: History reviewed. No pertinent family history. Family Psychiatric  History: Bipolar disorder: Sister Social History:  Social History   Substance and Sexual Activity  Alcohol Use None     Social History   Substance and Sexual Activity  Drug Use Not on file    Social History   Socioeconomic History   Marital status: Divorced    Spouse name: Not on file   Number of children: Not on file   Years of education: Not on file   Highest education level: Not on file  Occupational History   Not on file  Tobacco Use   Smoking status: Every Day    Packs/day: 1.00    Years: 10.00    Pack years: 10.00    Types: Cigarettes   Smokeless tobacco: Never  Substance and Sexual Activity   Alcohol use: Not on file   Drug use: Not on file   Sexual activity: Not on file  Other Topics Concern   Not on file  Social History Narrative   Not on file  Social Determinants of Health   Financial Resource Strain: Not on file  Food Insecurity: Not on file  Transportation Needs: Not on file  Physical Activity: Not on file  Stress: Not on file  Social Connections: Not on file   Additional Social History:  Divorced, has 1 son, employed, from [place], but lives in [place], [place].  Sleep: Fair  Appetite:  Fair  Current Medications: Current Facility-Administered Medications  Medication Dose Route Frequency Provider Last Rate Last Admin   acetaminophen (TYLENOL) tablet 650 mg  650 mg Oral Q6H PRN Comer Locket, MD   650 mg at 06/03/21 2012    FLUoxetine (PROZAC) capsule 10 mg  10 mg Oral Daily Armandina Stammer I, NP   10 mg at 06/04/21 8185   haloperidol (HALDOL) tablet 5 mg  5 mg Oral Q6H PRN Armandina Stammer I, NP       Or   haloperidol lactate (HALDOL) injection 5 mg  5 mg Intramuscular Q6H PRN Armandina Stammer I, NP   5 mg at 06/03/21 1434   hydrOXYzine (ATARAX/VISTARIL) tablet 25 mg  25 mg Oral Q6H PRN Comer Locket, MD   25 mg at 06/03/21 0640   LORazepam (ATIVAN) tablet 1 mg  1 mg Oral Q6H PRN Comer Locket, MD       LORazepam (ATIVAN) tablet 1 mg  1 mg Oral TID Comer Locket, MD   1 mg at 06/04/21 1313   Followed by   Melene Muller ON 06/05/2021] LORazepam (ATIVAN) tablet 1 mg  1 mg Oral BID Comer Locket, MD       Followed by   Melene Muller ON 06/06/2021] LORazepam (ATIVAN) tablet 1 mg  1 mg Oral Daily Comer Locket, MD       multivitamin with minerals tablet 1 tablet  1 tablet Oral Daily Comer Locket, MD   1 tablet at 06/03/21 6314   nicotine (NICODERM CQ - dosed in mg/24 hours) patch 14 mg  14 mg Transdermal Daily Nira Conn A, NP       OLANZapine (ZYPREXA) tablet 5 mg  5 mg Oral QHS Nwoko, Agnes I, NP   5 mg at 06/03/21 2200   ondansetron (ZOFRAN-ODT) disintegrating tablet 4 mg  4 mg Oral Q6H PRN Comer Locket, MD   4 mg at 06/03/21 9702   thiamine tablet 100 mg  100 mg Oral Daily Ophelia Shoulder E, NP   100 mg at 06/03/21 6378   traZODone (DESYREL) tablet 50 mg  50 mg Oral QHS PRN Massengill, Harrold Donath, MD        Lab Results:  Results for orders placed or performed during the hospital encounter of 06/02/21 (from the past 48 hour(s))  Hemoglobin A1c     Status: None   Collection Time: 06/04/21  6:51 AM  Result Value Ref Range   Hgb A1c MFr Bld 4.8 4.8 - 5.6 %    Comment: (NOTE) Pre diabetes:          5.7%-6.4%  Diabetes:              >6.4%  Glycemic control for   <7.0% adults with diabetes    Mean Plasma Glucose 91.06 mg/dL    Comment: Performed at [facility], [address] [place], [place] [address]   Lipid panel     Status: Abnormal   Collection Time: 06/04/21  6:51 AM  Result Value Ref Range   Cholesterol 273 (H) 0 - 200 mg/dL  Triglycerides 116 <150 mg/dL   HDL 38 (L) >16 mg/dL   Total CHOL/HDL Ratio 7.2 RATIO   VLDL 23 0 - 40 mg/dL   LDL Cholesterol 109 (H) 0 - 99 mg/dL    Comment:        Total Cholesterol/HDL:CHD Risk Coronary Heart Disease Risk Table                     Men   Women  1/2 Average Risk   3.4   3.3  Average Risk       5.0   4.4  2 X Average Risk   9.6   7.1  3 X Average Risk  23.4   11.0        Use the calculated Patient Ratio above and the CHD Risk Table to determine the patient's CHD Risk.        ATP III CLASSIFICATION (LDL):  <100     mg/dL   Optimal  604-540  mg/dL   Near or Above                    Optimal  130-159  mg/dL   Borderline  981-191  mg/dL   High  >478     mg/dL   Very High Performed at [facility], [address], [place], [place] [address]     Blood Alcohol level:  Lab Results  Component Value Date   ETH <10 05/31/2021    Metabolic Disorder Labs: Lab Results  Component Value Date   HGBA1C 4.8 06/04/2021   MPG 91.06 06/04/2021   No results found for: PROLACTIN Lab Results  Component Value Date   CHOL 273 (H) 06/04/2021   TRIG 116 06/04/2021   HDL 38 (L) 06/04/2021   CHOLHDL 7.2 06/04/2021   VLDL 23 06/04/2021   LDLCALC 212 (H) 06/04/2021    Physical Findings: AIMS:  , ,  ,  ,    CIWA:  CIWA-Ar Total: 7 COWS:     Musculoskeletal: Strength & Muscle Tone: within normal limits Gait & Station: normal Patient leans: N/A  Psychiatric Specialty Exam:  Presentation  General Appearance: Disheveled  Eye Contact:Fleeting  Speech:Normal Rate  Speech Volume:Decreased  Handedness:Right   Mood and Affect  Mood:Anxious; Depressed; Dysphoric; Hopeless  Affect:Congruent; Constricted; Depressed   Thought Process  Thought Processes:Linear; Coherent  Descriptions of  Associations:Intact  Orientation:Full (Time, Place and Person)  Thought Content:Logical  History of Schizophrenia/Schizoaffective disorder:No data recorded Duration of Psychotic Symptoms:No data recorded Hallucinations:Hallucinations: None Description of Auditory Hallucinations: "Voices in my head telling me ways to kill myself".  Ideas of Reference:None  Suicidal Thoughts:Suicidal Thoughts: Yes, Passive SI Active Intent and/or Plan: Without Intent; Without Plan SI Passive Intent and/or Plan: Without Intent; Without Plan; Without Means to Carry Out; Without Access to Means  Homicidal Thoughts:Homicidal Thoughts: No   Sensorium  Memory:Immediate Fair; Recent Fair; Remote Fair  Judgment:Poor  Insight:Poor   Executive Functions  Concentration:Fair  Attention Span:Fair  Recall:Fair  Fund of Knowledge:Fair  Language:Good   Psychomotor Activity  Psychomotor Activity:Psychomotor Activity: Decreased  Assets  Assets:Communication Skills; Financial Resources/Insurance; Desire for Improvement; Housing; Resilience; Social Support; Vocational/Educational  Sleep  Sleep:Sleep: Fair Number of Hours of Sleep: 6.5  Physical Exam: Physical Exam Vitals and nursing note reviewed.  HENT:     Head: Normocephalic.  Pulmonary:     Effort: Pulmonary effort is normal.  Musculoskeletal:        General: Normal range of motion.  Cervical back: Normal range of motion.  Neurological:     General: No focal deficit present.     Mental Status: He is alert.   Review of Systems  Constitutional: Negative.  Negative for fever.  HENT: Negative.  Negative for congestion.   Respiratory: Negative.  Negative for cough and shortness of breath.   Cardiovascular: Negative.  Negative for chest pain.  Gastrointestinal: Negative.  Negative for diarrhea, nausea and vomiting.  Genitourinary: Negative.   Musculoskeletal: Negative.   Neurological:  Positive for headaches.   Blood pressure  93/60, pulse 63, temperature 98.1 F (36.7 C), temperature source Oral, resp. rate 20, height 6' (1.829 m), weight 70.3 kg, SpO2 98 %. Body mass index is 21.02 kg/m.   Treatment Plan Summary: Daily contact with patient to assess and evaluate symptoms and progress in treatment and Medication management  Labs reviewed today:  Lipid profile with cholesterol 273 and LDL 212. A1c 4.8   EKG results today:  Vent. rate 62 BPM PR interval 168 ms QRS duration 102 ms QT/QTcB 438/444 ms P-R-T axes 73 77 98 Normal sinus rhythm Cannot rule out Inferior infarct , age undetermined Anterior infarct , age undetermined T wave abnormality, consider lateral ischemia Abnormal ECG  Compared with EKG from [facility] on 05/26/2021: Diagnosis Class Abnormal  Acquisition Device D3K  Ventricular Rate 71  Atrial Rate 71  P-R Interval 168  QRS Duration 102  Q-T Interval 394  QTC Calculation(Bazett) 428  Calculated P Axis 64  Calculated R Axis 75  Calculated T Axis 54   Diagnosis Normal sinus rhythm  Cannot rule out Inferior infarct (cited on or before 07-Jan-2018)  Anteroseptal infarct (cited on or before 12-Nov-2014)  Abnormal ECG  When compared with ECG of 18-Apr-2021 11:04,  No significant change was found   Alcohol/benzodiazepine withdrawal symptoms.  Will continue the CIWA detox protocol as already in progress Continue Ativan taper   Bipolar disorder.  Increase Olanzapine to 10 mg po Q hs on 10/15 to better target Bipolar depression and aid with sleep   Major depression. Continue Fluoxetine 10 mg po daily today, then  Increase Fluoxetine to 20 mg on 10/16 for better control of depressive symptoms    Anxiety. Continue Vistaril 25 mg po Q 6 hrs prn.   Insomnia.  Continue Trazodone 50 mg po Q bedtime.   Other prn medication: continue,  Acetaminophen 650 mg po 6 hrs prn for pain/fever. Zofran-ODT 4 mg po Q 6 hrs prn for N/V. Thiamine 100 mg po daily for thiamine  replacement. Multivitamin 1 tab po daily for nutritional supplementation.   Nicotine withdrawal symptoms. Continue nicotine patch 14 mg  transdermally Q 24 hours.   Continue every 15 minute safety checks Encourage participation in the therapeutic milieu Discharge planning in progress - Patient will need PCP appointment for follow up for hyperlipidemia   Laveda Abbe, NP 06/04/2021, 4:11 PM

## 2021-06-04 NOTE — [facility] Group Notes (Signed)
Pt did not attend Psych-Educational Group.  

## 2021-06-04 NOTE — Progress Notes (Signed)
Psychoeducational Group Note  Date:  06/04/2021 Time:  2000  Group Topic/Focus:  Wrap up group  Participation Level: Did Not Attend  Participation Quality:  Not Applicable  Affect:  Not Applicable  Cognitive:  Not Applicable  Insight:  Not Applicable  Engagement in Group: Not Applicable  Additional Comments:  Did not attend.   Marcille Buffy 06/04/2021, 9:17 PM

## 2021-06-04 NOTE — Progress Notes (Signed)
   06/03/21 2030  Psych Admission Type (Psych Patients Only)  Admission Status Voluntary  Psychosocial Assessment  Patient Complaints Other (Comment)  Eye Contact Brief  Facial Expression Anxious;Sad  Affect Anxious;Depressed;Sad  Speech Logical/coherent;Soft  Interaction Assertive  Motor Activity Slow  Appearance/Hygiene In scrubs  Behavior Characteristics Appropriate to situation;Anxious  Mood Depressed  Thought Process  Coherency WDL  Content WDL  Delusions None reported or observed  Perception Hallucinations  Hallucination Auditory;Command  Judgment Poor  Confusion None  Danger to Self  Current suicidal ideation? Passive  Self-Injurious Behavior Some self-injurious ideation observed or expressed.  No lethal plan expressed   Agreement Not to Harm Self Yes  Description of Agreement verbal  Danger to Others  Danger to Others None reported or observed

## 2021-06-04 NOTE — Progress Notes (Signed)
D: Patient has been in bed majority of the day. He did get up for meals and an ekg. He reports passive SI today and feels safe in the milieu. He denies any thoughts of harming anyone. He does not appear to be responding to internal stimuli. He is quiet and isolative. He is withdrawn. He has not attended any groups today.  A: Continue to monitor medication management and MD orders.  Safety checks completed every 15 minutes per protocol.  Offer support and encouragement as needed.  R: Patient is receptive to staff; he remains isolative and withdrawn.  06/04/21 1800  Psych Admission Type (Psych Patients Only)  Admission Status Voluntary  Psychosocial Assessment  Patient Complaints Malaise  Eye Contact Brief  Facial Expression Sad  Affect Depressed;Sad  Speech Logical/coherent  Interaction Forwards little  Motor Activity Slow  Appearance/Hygiene Unremarkable  Behavior Characteristics Appropriate to situation  Mood Depressed  Thought Process  Coherency WDL  Content WDL  Delusions None reported or observed  Perception WDL  Hallucination None reported or observed  Judgment Poor  Confusion None  Danger to Self  Current suicidal ideation? Passive  Description of Agreement verbal  Danger to Others  Danger to Others None reported or observed

## 2021-06-04 NOTE — [facility] Counselor (Signed)
Clinical Social Work Note  CSW approached patient to do his Psychosocial Assessment, but he asked if it could be done another time because he felt bad.  CSW offered to move it to tomorrow.  CSW team will continue efforts to meet with him.  Ambrose Mantle, LCSW 06/04/2021, 4:15 PM

## 2021-06-04 NOTE — Group Note (Signed)
Group Topic: Goal Setting  Group Date: 06/04/2021 Start Time: 0900 End Time: 1000 Facilitators: Dione Housekeeper, RN  Department: [facility] IN[facility]IENT ADULT [facility]  Number of Participants: 13  Group Focus: affirmation, clarity of thought, and goals/reality orientation Treatment Modality:  Psychoeducation Interventions utilized were assignment, group exercise, and support Purpose: To be able to understand and verbalize the reason for their admission to the hospital. To understand that the medication helps with their chemical imbalance but they also need to work on their choices in life. To be challenged to develop a list of 30 positives about themselves. Also introduce the concept that "feelings" are not reality.  Name: Phillip Marquez Date of Birth: 19-Oct-1960  MR: [id number]    Level of Participation: did not attend  Patients Problems:  Patient Active Problem List   Diagnosis Date Noted   Alcohol use disorder, severe, dependence (HCC) 06/03/2021   Bipolar depression (HCC) 06/03/2021   Severe benzodiazepine use disorder (HCC) 06/03/2021   Polysubstance abuse (HCC) 06/01/2021   Substance induced mood disorder (HCC) 06/01/2021

## 2021-06-04 NOTE — [facility] Group Notes (Signed)
Pt did not attend Relaxation Group. 

## 2021-06-04 NOTE — [facility] Group Notes (Signed)
Adult Psychoeducational Group Note  Date:  06/04/2021 Time:  10:31 AM  Group Topic/Focus:  Goals Group:   The focus of this group is to help patients establish daily goals to achieve during treatment and discuss how the patient can incorporate goal setting into their daily lives to aide in recovery.  Participation Level:  Did Not Attend  Margaret Pyle 06/04/2021, 10:31 AM

## 2021-06-05 DIAGNOSIS — I426 Alcoholic cardiomyopathy: Secondary | ICD-10-CM

## 2021-06-05 LAB — RENAL FUNCTION PANEL
Albumin: 4.2 g/dL (ref 3.5–5.0)
Anion gap: 8 (ref 5–15)
BUN: 26 mg/dL — ABNORMAL HIGH (ref 8–23)
CO2: 27 mmol/L (ref 22–32)
Calcium: 9.1 mg/dL (ref 8.9–10.3)
Chloride: 106 mmol/L (ref 98–111)
Creatinine, Ser: 1.31 mg/dL — ABNORMAL HIGH (ref 0.61–1.24)
GFR, Estimated: >60 mL/min (ref >60)
Glucose, Bld: 121 mg/dL — ABNORMAL HIGH (ref 70–99)
Phosphorus: 3.1 mg/dL (ref 2.5–4.6)
Potassium: 3.7 mmol/L (ref 3.5–5.1)
Sodium: 141 mmol/L (ref 135–145)

## 2021-06-05 LAB — TSH: TSH: 0.404 u[IU]/mL (ref 0.350–4.500)

## 2021-06-05 LAB — PROTIME-INR
INR: 1 (ref 0.8–1.2)
Prothrombin Time: 13 seconds (ref 11.4–15.2)

## 2021-06-05 MED ORDER — HYDROXYZINE HCL 25 MG PO TABS
25.0000 mg | ORAL_TABLET | Freq: Four times a day (QID) | ORAL | Status: AC | PRN
  Administered 2021-06-05 – 2021-06-08 (×6): 25 mg via ORAL
  Filled 2021-06-05 (×6): qty 1

## 2021-06-05 MED ORDER — LORAZEPAM 1 MG PO TABS
1.0000 mg | ORAL_TABLET | Freq: Four times a day (QID) | ORAL | Status: DC | PRN
  Administered 2021-06-05: 1 mg via ORAL

## 2021-06-05 MED ORDER — ONDANSETRON 4 MG PO TBDP: 4.0000 mg | ORAL_TABLET | Freq: Four times a day (QID) | ORAL | Status: AC | PRN

## 2021-06-05 NOTE — Progress Notes (Signed)
Patient has been in bed all night other than to come to medication window for his meds. He asked about getting another ativan but it was too soon. He received tylenol and zofran for his c/o nausea and headache. He returned to his room to rest some more.

## 2021-06-05 NOTE — Progress Notes (Addendum)
D: Patient is focused on his ativan. He approached this nurse and requested a "haldol and ativan injection." I asked why he felt he needed an injection and he stated, "My mind is racing and when I feel like this I'm gonna go into a rage." Patient states that he is giving me "a heads up." I informed him I would speak to the doctor. I returned and informed him that the MD was not receptive to an injection. Patient also exhibited extreme lip smacking and I asked if this is new. He replied, "I do this when I feel I'm about to go off." Patient was given a prn 1 mg ativan and 2 mg haldol. Patient took the meds and immediately stopped the lip smacking. He then went to his room to lie down. Patient states he has passive SI; however, he feels sale in the milieu.  A: Continue to monitor medication management and MD orders.  Safety checks completed every 15 minutes per protocol.  Offer support and encouragement as needed.  R: Patient is receptive to staff; he remains isolative to his room.  06/05/21 1400  Psych Admission Type (Psych Patients Only)  Admission Status Voluntary  Psychosocial Assessment  Patient Complaints Malaise  Eye Contact Brief  Facial Expression Sad  Affect Depressed  Speech Soft  Interaction Forwards little  Motor Activity Slow  Appearance/Hygiene Unremarkable  Behavior Characteristics Appropriate to situation  Mood Depressed  Thought Process  Coherency WDL  Content WDL  Delusions None reported or observed  Perception WDL  Hallucination None reported or observed  Judgment Poor  Confusion None  Danger to Self  Current suicidal ideation? Passive  Description of Agreement verbal  Danger to Others  Danger to Others None reported or observed

## 2021-06-05 NOTE — Consult Note (Signed)
Called by [facility] staff RE: anticoagulation and CHF recommendation. This is a 60 yo M presenting with SI, polysubstance abuse. 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 He was started on a lovenox-to-coumadin bridge. He has not been compliant with that medication. At the time he was also noted to have cardiomyopathy w/ an EF of 30-35%. He was started on toprol and cozaar. He was directed to follow up with the CHF clinic. He has not continued this medication, neither has he followed up with the CHF clinic.   For his thrombus, the major questions are will he be compliant and will he have proper follow up. If both can be arranged, then I recommend resuming the previous recommendation. Pharmacy will help run the bridge and make discharge dosing recs.   For his cardiomyopathy, his HR has been running 60 - 70. His SBPs have been from 90 - 120. I would hold off on the beta-blocker for now. Check a renal function panel. If his creatinine is WNL; can start a low dose ACEi (lisinopril 5mg ) and have him follow up with his PCP or w/ the CHF clinic as originally recommended.   I have relayed these recommendation to the attending physician.

## 2021-06-05 NOTE — [facility] Group Notes (Signed)
Pt did not attend the morning group with the nurse. 

## 2021-06-05 NOTE — Progress Notes (Addendum)
[facility] MD Progress Note  06/05/2021 9:50 AM Phillip Marquez  MRN:  [id number] Subjective:  "I don't feel good today"   Objective: Patient is a 60 year old Caucasian male with hx of Bipolar disorder, current episode depressed, alcohol use disorder, benzodiazepine use disorder & cocaine use disorder. He has been a regular patient in another psychiatric hospital x multiple times all related to substance use & bipolar disorder symptoms.  Phillip Marquez is admitted to [facility] from the [facility] hospital with complaints of worsening alcohol use, benzodiazepine use & suicidal ideations with a plan to drive his car into a bridge. His UDS was positive for benzodiazepine & has a BAL of <10.   Evaluation on the unit today, 10/16: Patient is seen in his room today. He is lying in his bed with the covers pulled up to his chin, asleep. He is easily aroused but stated "I don't feel good today." He does sit up on the edge of the bed when asked to. He denies any withdrawal symptoms, his last CIWA score was 7. He is not very talkative, his eye contact is fair and he appears depressed and sad. He stated he slept poorly last night, record reflects he slept 4.5 hours. He stated he had troubling falling and staying asleep. He did receive any Trazodone last night.  He is on an ativan taper for alcohol and benzodiazepine withdrawal. He is focused on Ativan. His tremors appear to be volitional in nature. He came to the nurses station today and was lip smacking and protruding his tongue. He told staff RN he was "giving a heads up he was going to go into a rage" if he did not receive Haldol and Ativan. He got haldol 5 mg and Ativan 1 mg at 1457 for agitation. He had witnessed another patient receive Ativan for having an outburst on in the cafeteria. As soon as he got the medications he immediately stopped smacking his lips and protruding his tongue. His last dose on the Ativan taper. He is taking his Zyprexa and Prozac and has no issues with them. He  denies SI/HI/AVH, paranoia and delusions. He appears depressed, he is not attending group or seen in the therapeutic milieu. He was informed today that he will be on room lockout if he does not come out of his room and start going to group. He is going to the cafeteria for meals.  He is able to contract for safety on the unit. He stated he is interested in a 30 day rehab for substance abuse. He is able to contract for safety on the unit.   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 (See 04/06/2021 [facility] in chart review). He was recommended for anticoagulant therapy and was bridged from Lovenox to Coumadin, however he did not follow up after he was discharged and he continues to drink heavily. Medicine was consulted today (see note in chart for recommendations). I discussed his options with him and the risks and benefits of anticoagulation versus doing nothing he stated he will think about what he wants to do. He stated he did not take it seriously in August. I told him to myself to the staff know if he comes to a decision.   PRN's in the last 24 hours:  Tylenol, Vistaril.   Principal Problem: Bipolar depression (HCC) Diagnosis: Principal Problem:   Bipolar depression (HCC) Active Problems:   Alcohol use disorder, severe, dependence (HCC)  Severe benzodiazepine use disorder (HCC)  Total Time spent with patient: 20 minutes  Past Psychiatric History:  Yes, Bipolar disorder, alcohol use disorder, cocaine use disorder.  Past Medical History: History reviewed. No pertinent past medical history. History reviewed. No pertinent surgical history. Family History: History reviewed. No pertinent family history. Family Psychiatric  History: Bipolar disorder: Sister Social History:  Social History   Substance and Sexual Activity  Alcohol Use None     Social History   Substance and Sexual Activity  Drug Use Not on file    Social History    Socioeconomic History   Marital status: Divorced    Spouse name: Not on file   Number of children: Not on file   Years of education: Not on file   Highest education level: Not on file  Occupational History   Not on file  Tobacco Use   Smoking status: Every Day    Packs/day: 1.00    Years: 10.00    Pack years: 10.00    Types: Cigarettes   Smokeless tobacco: Never  Substance and Sexual Activity   Alcohol use: Not on file   Drug use: Not on file   Sexual activity: Not on file  Other Topics Concern   Not on file  Social History Narrative   Not on file   Social Determinants of Health   Financial Resource Strain: Not on file  Food Insecurity: Not on file  Transportation Needs: Not on file  Physical Activity: Not on file  Stress: Not on file  Social Connections: Not on file   Additional Social History:  Divorced, has 1 son, employed, from [place], but lives in [place], [place].  Sleep: Fair  Appetite:  Fair  Current Medications: Current Facility-Administered Medications  Medication Dose Route Frequency Provider Last Rate Last Admin   acetaminophen (TYLENOL) tablet 650 mg  650 mg Oral Q6H PRN Bartholomew Crews E, MD   650 mg at 06/04/21 2121   FLUoxetine (PROZAC) capsule 20 mg  20 mg Oral Daily Laveda Abbe, NP       haloperidol (HALDOL) tablet 5 mg  5 mg Oral Q6H PRN Armandina Stammer I, NP       Or   haloperidol lactate (HALDOL) injection 5 mg  5 mg Intramuscular Q6H PRN Armandina Stammer I, NP   5 mg at 06/03/21 1434   hydrOXYzine (ATARAX/VISTARIL) tablet 25 mg  25 mg Oral Q6H PRN Comer Locket, MD   25 mg at 06/03/21 0640   LORazepam (ATIVAN) tablet 1 mg  1 mg Oral Q6H PRN Comer Locket, MD   1 mg at 06/05/21 0250   LORazepam (ATIVAN) tablet 1 mg  1 mg Oral BID Comer Locket, MD       Followed by   Melene Muller ON 06/06/2021] LORazepam (ATIVAN) tablet 1 mg  1 mg Oral Daily Mason Jim, Leamon Palau E, MD       multivitamin with minerals tablet 1 tablet  1 tablet Oral  Daily Comer Locket, MD   1 tablet at 06/03/21 3762   nicotine (NICODERM CQ - dosed in mg/24 hours) patch 14 mg  14 mg Transdermal Daily Nira Conn A, NP       OLANZapine (ZYPREXA) tablet 10 mg  10 mg Oral QHS Laveda Abbe, NP   10 mg at 06/04/21 2121   ondansetron (ZOFRAN-ODT) disintegrating tablet 4 mg  4 mg Oral Q6H PRN Comer Locket, MD   4 mg at 06/04/21 2121  thiamine tablet 100 mg  100 mg Oral Daily Ophelia Shoulder E, NP   100 mg at 06/03/21 4132   traZODone (DESYREL) tablet 50 mg  50 mg Oral QHS PRN Phineas Inches, MD   50 mg at 06/04/21 2121    Lab Results:  Results for orders placed or performed during the hospital encounter of 06/02/21 (from the past 48 hour(s))  Hemoglobin A1c     Status: None   Collection Time: 06/04/21  6:51 AM  Result Value Ref Range   Hgb A1c MFr Bld 4.8 4.8 - 5.6 %    Comment: (NOTE) Pre diabetes:          5.7%-6.4%  Diabetes:              >6.4%  Glycemic control for   <7.0% adults with diabetes    Mean Plasma Glucose 91.06 mg/dL    Comment: Performed at [facility], [address], [place], [place] [address]  Lipid panel     Status: Abnormal   Collection Time: 06/04/21  6:51 AM  Result Value Ref Range   Cholesterol 273 (H) 0 - 200 mg/dL   Triglycerides 272 <536 mg/dL   HDL 38 (L) >64 mg/dL   Total CHOL/HDL Ratio 7.2 RATIO   VLDL 23 0 - 40 mg/dL   LDL Cholesterol 403 (H) 0 - 99 mg/dL    Comment:        Total Cholesterol/HDL:CHD Risk Coronary Heart Disease Risk Table                     Men   Women  1/2 Average Risk   3.4   3.3  Average Risk       5.0   4.4  2 X Average Risk   9.6   7.1  3 X Average Risk  23.4   11.0        Use the calculated Patient Ratio above and the CHD Risk Table to determine the patient's CHD Risk.        ATP III CLASSIFICATION (LDL):  <100     mg/dL   Optimal  474-259  mg/dL   Near or Above                    Optimal  130-159  mg/dL   Borderline  563-875  mg/dL   High  >643      mg/dL   Very High Performed at [facility], [address] [place], [place] [address]     Blood Alcohol level:  Lab Results  Component Value Date   ETH <10 05/31/2021    Metabolic Disorder Labs: Lab Results  Component Value Date   HGBA1C 4.8 06/04/2021   MPG 91.06 06/04/2021   No results found for: PROLACTIN Lab Results  Component Value Date   CHOL 273 (H) 06/04/2021   TRIG 116 06/04/2021   HDL 38 (L) 06/04/2021   CHOLHDL 7.2 06/04/2021   VLDL 23 06/04/2021   LDLCALC 212 (H) 06/04/2021    Physical Findings: AIMS:  , ,  ,  ,    CIWA:  CIWA-Ar Total: 3 COWS:     Musculoskeletal: Strength & Muscle Tone: within normal limits Gait & Station: normal Patient leans: N/A  Psychiatric Specialty Exam:  Presentation  General Appearance: Disheveled  Eye Contact:Fleeting  Speech:Normal Rate  Speech Volume:Decreased  Handedness:Right   Mood and Affect  Mood:Anxious; Depressed; Dysphoric; Hopeless  Affect:Congruent; Constricted; Depressed  Thought Process  Thought Processes:Linear; Coherent  Descriptions of Associations:Intact  Orientation:Full (Time, Place and Person)  Thought Content:Logical  History of Schizophrenia/Schizoaffective disorder:No data recorded Duration of Psychotic Symptoms:No data recorded Hallucinations:No data recorded  Ideas of Reference:None  Suicidal Thoughts:No data recorded  Homicidal Thoughts:No data recorded   Sensorium  Memory:Immediate Fair; Recent Fair; Remote Fair  Judgment:Poor  Insight:Poor   Executive Functions  Concentration:Fair  Attention Span:Fair  Recall:Fair  Fund of Knowledge:Fair  Language:Good   Psychomotor Activity  Psychomotor Activity:No data recorded  Assets  Assets:Communication Skills; Financial Resources/Insurance; Desire for Improvement; Housing; Resilience; Social Support; Vocational/Educational  Sleep  Sleep:No data recorded  Physical Exam: Physical  Exam Vitals and nursing note reviewed.  HENT:     Head: Normocephalic.  Pulmonary:     Effort: Pulmonary effort is normal.  Musculoskeletal:        General: Normal range of motion.     Cervical back: Normal range of motion.  Neurological:     General: No focal deficit present.     Mental Status: He is alert.   Review of Systems  Constitutional: Negative.  Negative for fever.  HENT: Negative.  Negative for congestion.   Respiratory: Negative.  Negative for cough and shortness of breath.   Cardiovascular: Negative.  Negative for chest pain.  Gastrointestinal: Negative.  Negative for diarrhea, nausea and vomiting.  Genitourinary: Negative.   Musculoskeletal: Negative.   Neurological:  Positive for headaches.   Blood pressure 125/78, pulse 72, temperature 98.1 F (36.7 C), temperature source Oral, resp. rate 20, height 6' (1.829 m), weight 70.3 kg, SpO2 98 %. Body mass index is 21.02 kg/m.   Treatment Plan Summary: Daily contact with patient to assess and evaluate symptoms and progress in treatment and Medication management  Labs reviewed 10/15:  Lipid profile with cholesterol 273 and LDL 212. A1c 4.8   EKG results 10/15:  Vent. rate 62 BPM PR interval 168 ms QRS duration 102 ms QT/QTcB 438/444 ms P-R-T axes 73 77 98 Normal sinus rhythm Cannot rule out Inferior infarct , age undetermined Anterior infarct , age undetermined T wave abnormality, consider lateral ischemia Abnormal ECG  Compared with EKG from [facility] on 05/26/2021: Diagnosis Class Abnormal  Acquisition Device D3K  Ventricular Rate 71  Atrial Rate 71  P-R Interval 168  QRS Duration 102  Q-T Interval 394  QTC Calculation(Bazett) 428  Calculated P Axis 64  Calculated R Axis 75  Calculated T Axis 54   Diagnosis Normal sinus rhythm  Cannot rule out Inferior infarct (cited on or before 07-Jan-2018)  Anteroseptal infarct (cited on or before 12-Nov-2014)  Abnormal ECG  When compared with ECG of  18-Apr-2021 11:04,  No significant change was found   Alcohol/benzodiazepine withdrawal symptoms.  Will continue the CIWA detox protocol as already in progress Continue Ativan taper, last dose on 10/17.    Bipolar disorder.  Continue Olanzapine to 10 mg PO QHS   Major depression. Continue Fluoxetine 20 mg PO daily, increased on 10/16, then    Anxiety. Continue Vistaril 25 mg PO every 6 hrs prn.   Insomnia.  Continue Trazodone 50 mg po Q bedtime.  Continue agitation protocol: Haldol 5 mg PO or IM    Other prn medication: continue,  Acetaminophen 650 mg PO every 6 hrs prn for pain/fever. Zofran-ODT 4 mg PO every 6 hrs prn for N/V. Thiamine 100 mg PO daily for thiamine replacement. Multivitamin 1 tab PO daily for nutritional supplementation.   Nicotine withdrawal symptoms. Continue  nicotine patch 14 mg  transdermal every 24 hours.   Continue every 15 minute safety checks Encourage participation in the therapeutic milieu Discharge planning in progress - Patient will need PCP appointment for follow up for hyperlipidemia and cardiology appointment or referral for cardiomyopathy and anticoagulant therapy   Laveda Abbe, NP 06/05/2021, 6:02 PM  I have reviewed the patient's chart and discussed the case with the APP. I agree with the assessment and plan of care as documented in the APP's note.  Bartholomew Crews, MD, Celene Skeen

## 2021-06-05 NOTE — Group Note (Signed)
LCSW Group Therapy Note Late Note for 06/04/2021  Clinical Social Work Note  No group could be held this day due to low staffing.  Other licensed group was held.  Ambrose Mantle, LCSW 06/04/2021

## 2021-06-05 NOTE — Group Note (Signed)
  [facility] LCSW Group Therapy Note  Date/Time:  06/05/2021 10:00AM-11:00AM  Type of Therapy and Topic:  Group Therapy:  Self-Care Wheel  Participation Level:  Did Not Attend   Description of Group This process group involved patients discussing the importance of self-care in different areas of life (professional, personal, emotional, psychological, spiritual, and physical) in order to achieve healthy life balance.  The group talked about what self-care in each of those areas would constitute and then specifically listed how they want to provide themselves with improved self-care.  Therapeutic Goals Patient will learn how to break self-care down into various areas of life Patient will participate in generating ideas about healthy self-care options in each category Patients will be supportive of one another and receive support from others Patient will identify one healthy self-care activity to add to his/her life   Summary of Patient Progress:  The patient was invited to group, did not attend.  Therapeutic Modalities Processing Psychoeducation   Ambrose Mantle, LCSW 06/05/2021 2:53 PM

## 2021-06-05 NOTE — Group Note (Unsigned)
Group Topic: Other  Group Date: 06/05/2021 Start Time: 0900 End Time: 1000 Facilitators: Dione Housekeeper, RN  [facility]  Number of Participants: 16  Group Focus: anxiety and clarity of thought Treatment Modality:  Cognitive Behavioral Therapy, Skills Training, and Spiritual Interventions utilized were exploration and support Purpose: enhance coping skills and reinforce self-care   Name: Franky Reier Date of Birth: 11-02-1960  MR: [id number]    Level of Participation: {THERAPIES; PSYCH GROUP PARTICIPATION BEMLJ:44920} Quality of Participation: {THERAPIES; PSYCH QUALITY OF PARTICIPATION:23992} Interactions with others: {THERAPIES; PSYCH INTERACTIONS:23993} Mood/Affect: {THERAPIES; PSYCH MOOD/AFFECT:23994} Triggers (if applicable): *** Cognition: {THERAPIES; PSYCH COGNITION:23995} Progress: {THERAPIES; PSYCH PROGRESS:23997} Response: *** Plan: {THERAPIES; PSYCH FEOF:12197}  Patients Problems:  Patient Active Problem List   Diagnosis Date Noted   Alcohol use disorder, severe, dependence (HCC) 06/03/2021   Bipolar depression (HCC) 06/03/2021   Severe benzodiazepine use disorder (HCC) 06/03/2021   Polysubstance abuse (HCC) 06/01/2021   Substance induced mood disorder (HCC) 06/01/2021

## 2021-06-05 NOTE — Progress Notes (Signed)
[facility] Group Notes:  (Nursing/MHT/Case Management/Adjunct)  Date:  06/05/2021  Time:  2015 Type of Therapy:   wrap up group  Participation Level:  Active  Participation Quality:  Appropriate, Attentive, Sharing, and Supportive  Affect:  Flat  Cognitive:  Alert  Insight:  Improving  Engagement in Group:  Engaged  Modes of Intervention:  Clarification, Education, and Support  Summary of Progress/Problems: Positive thinking and self-care were discussed.  Marcille Buffy 06/05/2021, 9:23 PM

## 2021-06-05 NOTE — [facility] Counselor (Signed)
Adult Comprehensive Assessment  Patient ID: Phillip Marquez, male   DOB: 03-15-1961, 60 y.o.   MRN: [id number]  Information Source: Information source: Patient  Current Stressors:  Patient states their primary concerns and needs for treatment are:: "Detox" Patient states their goals for this hospitilization and ongoing recovery are:: "T odetox and get my head straight" Educational / Learning stressors: Denies stressor Employment / Job issues: Yes, with running his own business Family Relationships: Some stress Financial / Lack of resources (include bankruptcy): Some stress Housing / Lack of housing: Denies stressor Physical health (include injuries & life threatening diseases): Denies stressor Social relationships: Denies stressor Substance abuse: Denies stressor, however came to hospital for detox Bereavement / Loss: Denies stressor  Living/Environment/Situation:  Living Arrangements: Alone Living conditions (as described by patient or guardian): Lives in house Who else lives in the home?: Self How long has patient lived in current situation?: 25 years What is atmosphere in current home: Other (Comment) (Somewhat depressing)  Family History:  Marital status: Divorced Divorced, when?: 25 years What types of issues is patient dealing with in the relationship?: UTA Additional relationship information: n/a Are you sexually active?: No What is your sexual orientation?: Heterosexual Has your sexual activity been affected by drugs, alcohol, medication, or emotional stress?: Denies Does patient have children?: Yes How many children?: 1 How is patient's relationship with their children?: Has a son whom he has a good relationship with  Childhood History:  By whom was/is the patient raised?: Both parents Additional childhood history information: "It was ok" Description of patient's relationship with caregiver when they were a child: "Good" Patient's description of current relationship  with people who raised him/her: Father has passed away, relatinship with mother is "so-so" How were you disciplined when you got in trouble as a child/adolescent?: "Butt kicked" Does patient have siblings?: Yes Number of Siblings: 4 Description of patient's current relationship with siblings: Has 3 brothers and 1 sister whom he has fair relationships with Did patient suffer any verbal/emotional/physical/sexual abuse as a child?: No Did patient suffer from severe childhood neglect?: No Has patient ever been sexually abused/assaulted/raped as an adolescent or adult?: No Was the patient ever a victim of a crime or a disaster?: No Witnessed domestic violence?: No Has patient been affected by domestic violence as an adult?: No  Education:  Highest grade of school patient has completed: High school Currently a student?: No Learning disability?: No  Employment/Work Situation:   Employment Situation: Employed 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?: No  Financial Resources:   Financial resources: Income from employment Does patient have a representative payee or guardian?: No  Alcohol/Substance Abuse:   What has been your use of drugs/alcohol within the last 12 months?: States he drinks a 1/5 of liqour daily, takes valium daily, uses THC daily. He states he has used cocaine within the past year, but not recently. If attempted suicide, did drugs/alcohol play a role in this?: No Alcohol/Substance Abuse Treatment Hx: Past Tx, Inpatient, Past detox If yes, describe treatment: [facility] Has  alcohol/substance abuse ever caused legal problems?: Yes  Social  Support System:   Patient's Community Support System: Fair Museum/gallery exhibitions officer System: Son and brother Type of faith/religion: None How does patient's faith help to cope with current illness?: n/a  Leisure/Recreation:   Do You Have Hobbies?: Yes Leisure and Hobbies: "working on cars"  Strengths/Needs:   What is the patient's perception of their strengths?: "Working on cars" Patient states they can use these personal strengths during their treatment to contribute to their recovery: n/a Patient states these barriers may affect/interfere with their treatment: None Patient states these barriers may affect their return to the community: None Other important information patient would like considered in planning for their treatment: None  Discharge Plan:   Currently receiving community mental health services: No Patient states concerns and preferences for aftercare planning are: Pt is interested in medication management and substance use counseling. Pt is considering residential treatment and [facility] Patient states they will know when they are safe and ready for discharge when: Yes Does patient have access to transportation?: Yes Does patient have financial barriers related to discharge medications?: Yes Patient description of barriers related to discharge medications: no insurance Will patient be returning to same living situation after discharge?: Yes  Summary/Recommendations:   Summary and Recommendations (to be completed by the evaluator): [name] was admitted due to alcohol use and withdrawal symptoms. Pt has a hx of bipolar 1 disorder and alcohol use disorder. Recent stressors include being self-employed, family relationships, financial stress and substance use. Pt currently sees no outpatient providers. While here, Willey Due can benefit from crisis stabilization, medication management, therapeutic milieu, and  referrals for services.  Tyrann Donaho A Archita Lomeli. 06/05/2021

## 2021-06-05 NOTE — [facility] Group Notes (Signed)
Pt did not attend relaxation group this afternoon.  

## 2021-06-05 NOTE — [facility] Suicide Risk Assessment (Signed)
[facility] INPATIENT:  Family/Significant Other Suicide Prevention Education  Suicide Prevention Education:  Patient Refusal for Family/Significant Other Suicide Prevention Education: The patient Phillip Marquez has refused to provide written consent for family/significant other to be provided Family/Significant Other Suicide Prevention Education during admission and/or prior to discharge.  Physician notified.  Jerritt Cardoza A Annasofia Pohl 06/05/2021, 10:59 AM

## 2021-06-06 LAB — URINALYSIS, COMPLETE (UACMP) WITH MICROSCOPIC
Bilirubin Urine: NEGATIVE
Glucose, UA: NEGATIVE mg/dL
Hgb urine dipstick: NEGATIVE
Ketones, ur: NEGATIVE mg/dL
Leukocytes,Ua: NEGATIVE
Nitrite: NEGATIVE
Protein, ur: NEGATIVE mg/dL
Specific Gravity, Urine: 1.02 (ref 1.005–1.030)
pH: 5 (ref 5.0–8.0)

## 2021-06-06 MED ORDER — SERTRALINE HCL 50 MG PO TABS
50.0000 mg | ORAL_TABLET | Freq: Every day | ORAL | Status: DC
  Administered 2021-06-08 – 2021-06-10 (×3): 50 mg via ORAL
  Filled 2021-06-06 (×5): qty 1

## 2021-06-06 MED ORDER — MAGNESIUM HYDROXIDE 400 MG/5ML PO SUSP
30.0000 mL | Freq: Every day | ORAL | Status: DC | PRN
  Filled 2021-06-06: qty 30

## 2021-06-06 MED ORDER — WARFARIN - PHARMACIST DOSING INPATIENT
Freq: Every day | Status: DC
  Administered 2021-06-07: 7.5
  Administered 2021-06-10 – 2021-06-11 (×2): 1
  Filled 2021-06-06 (×16): qty 1

## 2021-06-06 MED ORDER — TRAZODONE HCL 50 MG PO TABS
50.0000 mg | ORAL_TABLET | Freq: Every evening | ORAL | Status: DC | PRN
Start: 2021-06-06 — End: 2021-06-07
  Administered 2021-06-06: 50 mg via ORAL
  Filled 2021-06-06 (×7): qty 1

## 2021-06-06 MED ORDER — ENOXAPARIN SODIUM 80 MG/0.8ML IJ SOSY
75.0000 mg | PREFILLED_SYRINGE | Freq: Two times a day (BID) | INTRAMUSCULAR | Status: DC
  Administered 2021-06-06 – 2021-06-08 (×4): 75 mg via SUBCUTANEOUS
  Filled 2021-06-06 (×6): qty 0.8

## 2021-06-06 MED ORDER — WARFARIN SODIUM 7.5 MG PO TABS
7.5000 mg | ORAL_TABLET | Freq: Once | ORAL | Status: AC
  Administered 2021-06-06: 7.5 mg via ORAL
  Filled 2021-06-06: qty 1

## 2021-06-06 MED ORDER — SERTRALINE HCL 25 MG PO TABS
25.0000 mg | ORAL_TABLET | Freq: Every day | ORAL | Status: AC
  Administered 2021-06-07: 25 mg via ORAL
  Filled 2021-06-06: qty 1

## 2021-06-06 MED ORDER — ALUM & MAG HYDROXIDE-SIMETH 200-200-20 MG/5ML PO SUSP
30.0000 mL | ORAL | Status: DC | PRN
  Administered 2021-06-06 – 2021-06-10 (×5): 30 mL via ORAL
  Filled 2021-06-06 (×4): qty 30

## 2021-06-06 NOTE — Progress Notes (Signed)
[facility] MD Progress Note  06/06/2021 4:44 PM Phillip Marquez  MRN:  [id number] Subjective:  "I don't feel good today"   Objective: Patient is a 60 year old Caucasian male with hx of Bipolar disorder, current episode depressed, alcohol use disorder, benzodiazepine use disorder & cocaine use disorder. He has been a regular patient in another psychiatric hospital x multiple times all related to substance use & bipolar disorder symptoms.  Phillip Marquez is admitted to [facility] from the [facility] hospital with complaints of worsening alcohol use, benzodiazepine use & suicidal ideations with a plan to drive his car into a bridge. His UDS was positive for benzodiazepine & has a BAL of <10.   Evaluation on the unit today, 10/17: Patient is seen in his room today. He is lying in his bed with the covers pulled up to his chin, asleep. He is easily aroused but stated "I don't feel good today." He does sit up on the edge of the bed when asked to. He denies any withdrawal symptoms, he has completed his CIWA ativan taper.  He is not very talkative, his eye contact is fair and he appears depressed and sad. He stated he slept poorly last night, record reflects he slept 5 hours. He did receive Trazodone last night.  He is focused on Ativan but has been informed that he will receive no more ativan or haldol. He is taking his Zyprexa and Prozac and has no issues with them. He denies SI/HI/AVH, paranoia and delusions. He appears depressed, he is not attending group or seen in the therapeutic milieu. He has been attending some groups and going to the cafeteria for meals.He stated he is interested in a 30 day rehab for substance abuse. He has decided to have a Lovenox to Coumadin bridge for his left ventricle thrombus (see below). The risks and benefits of anticoagulants have been explained to him. He is in agreement to follow up outpatient for continued Coumadin monitoring. He has also been cautioned to abstain from alcohol. He verbalized  understanding. He is able to contract for safety on the unit. See MAR for Lovenox to Coumadin bridge dosing.   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 (See 04/06/2021 [facility] in chart review). He was recommended for anticoagulant therapy and was bridged from Lovenox to Coumadin, however he did not follow up after he was discharged and he continues to drink heavily. Medicine was consulted today (see note in chart for recommendations). I discussed his options with him and the risks and benefits of anticoagulation versus doing nothing he stated he will think about what he wants to do. He stated he did not take it seriously in August.   PRN's in the last 24 hours:  Tylenol, Vistaril.   Principal Problem: Bipolar depression (HCC) Diagnosis: Principal Problem:   Bipolar depression (HCC) Active Problems:   Alcohol use disorder, severe, dependence (HCC)   Severe benzodiazepine use disorder (HCC)  Total Time spent with patient: 20 minutes  Past Psychiatric History:  Yes, Bipolar disorder, alcohol use disorder, cocaine use disorder.  Past Medical History: History reviewed. No pertinent past medical history. History reviewed. No pertinent surgical history. Family History: History reviewed. No pertinent family history. Family Psychiatric  History: Bipolar disorder: Sister Social History:  Social History   Substance and Sexual Activity  Alcohol Use None     Social History   Substance and Sexual Activity  Drug Use Not on file  Social History   Socioeconomic History   Marital status: Divorced    Spouse name: Not on file   Number of children: Not on file   Years of education: Not on file   Highest education level: Not on file  Occupational History   Not on file  Tobacco Use   Smoking status: Every Day    Packs/day: 1.00    Years: 10.00    Pack years: 10.00    Types: Cigarettes   Smokeless tobacco: Never  Substance and  Sexual Activity   Alcohol use: Not on file   Drug use: Not on file   Sexual activity: Not on file  Other Topics Concern   Not on file  Social History Narrative   Not on file   Social Determinants of Health   Financial Resource Strain: Not on file  Food Insecurity: Not on file  Transportation Needs: Not on file  Physical Activity: Not on file  Stress: Not on file  Social Connections: Not on file   Additional Social History:  Divorced, has 1 son, employed, from [place], but lives in [place], [place].  Sleep: Fair  Appetite:  Fair  Current Medications: Current Facility-Administered Medications  Medication Dose Route Frequency Provider Last Rate Last Admin   acetaminophen (TYLENOL) tablet 650 mg  650 mg Oral Q6H PRN Mason Jim, Amy E, MD   650 mg at 06/04/21 2121   enoxaparin (LOVENOX) injection 75 mg  75 mg Subcutaneous Q12H Singleton, Amy E, MD       hydrOXYzine (ATARAX/VISTARIL) tablet 25 mg  25 mg Oral Q6H PRN Mason Jim, Amy E, MD   25 mg at 06/06/21 1544   LORazepam (ATIVAN) tablet 1 mg  1 mg Oral Q6H PRN Comer Locket, MD   1 mg at 06/05/21 2358   multivitamin with minerals tablet 1 tablet  1 tablet Oral Daily Comer Locket, MD   1 tablet at 06/03/21 4801   nicotine (NICODERM CQ - dosed in mg/24 hours) patch 14 mg  14 mg Transdermal Daily Nira Conn A, NP       OLANZapine (ZYPREXA) tablet 10 mg  10 mg Oral QHS Laveda Abbe, NP   10 mg at 06/05/21 2108   ondansetron (ZOFRAN-ODT) disintegrating tablet 4 mg  4 mg Oral Q6H PRN Comer Locket, MD       [START ON 06/07/2021] sertraline (ZOLOFT) tablet 25 mg  25 mg Oral Daily Laveda Abbe, NP       Followed by   Melene Muller ON 06/08/2021] sertraline (ZOLOFT) tablet 50 mg  50 mg Oral Daily Laveda Abbe, NP       thiamine tablet 100 mg  100 mg Oral Daily Ophelia Shoulder E, NP   100 mg at 06/03/21 6553   traZODone (DESYREL) tablet 50 mg  50 mg Oral QHS PRN Phineas Inches, MD   50 mg at 06/05/21  2108   warfarin (COUMADIN) tablet 7.5 mg  7.5 mg Oral ONCE-1800 Comer Locket, MD       Warfarin - Pharmacist Dosing Inpatient   Does not apply [id number] Comer Locket, MD   Given at 06/06/21 1607    Lab Results:  Results for orders placed or performed during the hospital encounter of 06/02/21 (from the past 48 hour(s))  TSH     Status: None   Collection Time: 06/05/21  6:38 PM  Result Value Ref Range   TSH 0.404 0.350 - 4.500 uIU/mL    Comment: Performed by  a 3rd Generation assay with a functional sensitivity of <=0.01 uIU/mL. Performed at [facility], [address], [place], [place] [address]   Protime-INR     Status: None   Collection Time: 06/05/21  6:38 PM  Result Value Ref Range   Prothrombin Time 13.0 11.4 - 15.2 seconds   INR 1.0 0.8 - 1.2    Comment: (NOTE) INR goal varies based on device and disease states. Performed at [facility], [address] [place], [place] [address]   Renal function panel     Status: Abnormal   Collection Time: 06/05/21  6:38 PM  Result Value Ref Range   Sodium 141 135 - 145 mmol/L   Potassium 3.7 3.5 - 5.1 mmol/L   Chloride 106 98 - 111 mmol/L   CO2 27 22 - 32 mmol/L   Glucose, Bld 121 (H) 70 - 99 mg/dL    Comment: Glucose reference range applies only to samples taken after fasting for at least 8 hours.   BUN 26 (H) 8 - 23 mg/dL   Creatinine, Ser 1.60 (H) 0.61 - 1.24 mg/dL   Calcium 9.1 8.9 - 73.7 mg/dL   Phosphorus 3.1 2.5 - 4.6 mg/dL   Albumin 4.2 3.5 - 5.0 g/dL   GFR, Estimated >10 >62 mL/min    Comment: (NOTE) Calculated using the CKD-EPI Creatinine Equation (2021)    Anion gap 8 5 - 15    Comment: Performed at [facility], [address] [place], [place] [address]  Urinalysis, Complete w Microscopic Urine, Random     Status: Abnormal   Collection Time: 06/05/21  7:04 PM  Result Value Ref Range   Color, Urine YELLOW YELLOW   APPearance CLEAR CLEAR   Specific  Gravity, Urine 1.020 1.005 - 1.030   pH 5.0 5.0 - 8.0   Glucose, UA NEGATIVE NEGATIVE mg/dL   Hgb urine dipstick NEGATIVE NEGATIVE   Bilirubin Urine NEGATIVE NEGATIVE   Ketones, ur NEGATIVE NEGATIVE mg/dL   Protein, ur NEGATIVE NEGATIVE mg/dL   Nitrite NEGATIVE NEGATIVE   Leukocytes,Ua NEGATIVE NEGATIVE   RBC / HPF 0-5 0 - 5 RBC/hpf   WBC, UA 0-5 0 - 5 WBC/hpf   Bacteria, UA RARE (A) NONE SEEN   Squamous Epithelial / LPF 0-5 0 - 5   Mucus PRESENT     Comment: Performed at [facility], [address] [place], [place] [address]    Blood Alcohol level:  Lab Results  Component Value Date   ETH <10 05/31/2021    Metabolic Disorder Labs: Lab Results  Component Value Date   HGBA1C 4.8 06/04/2021   MPG 91.06 06/04/2021   No results found for: PROLACTIN Lab Results  Component Value Date   CHOL 273 (H) 06/04/2021   TRIG 116 06/04/2021   HDL 38 (L) 06/04/2021   CHOLHDL 7.2 06/04/2021   VLDL 23 06/04/2021   LDLCALC 212 (H) 06/04/2021    Physical Findings: AIMS:  , ,  ,  ,    CIWA:  CIWA-Ar Total: 10 COWS:     Musculoskeletal: Strength & Muscle Tone: within normal limits Gait & Station: normal Patient leans: N/A  Psychiatric Specialty Exam:  Presentation  General Appearance: Disheveled  Eye Contact:Fleeting  Speech:Normal Rate  Speech Volume:Decreased  Handedness:Right   Mood and Affect  Mood:Anxious; Depressed; Dysphoric; Hopeless  Affect:Congruent; Constricted; Depressed   Thought Process  Thought Processes:Linear; Coherent  Descriptions of Associations:Intact  Orientation:Full (Time, Place and Person)  Thought Content:Logical  History of Schizophrenia/Schizoaffective disorder:No data recorded Duration of Psychotic Symptoms:No data recorded Hallucinations:No data recorded  Ideas of Reference:None  Suicidal Thoughts:No data recorded  Homicidal Thoughts:No data recorded   Sensorium  Memory:Immediate Fair; Recent Fair;  Remote Fair  Judgment:Poor  Insight:Poor   Executive Functions  Concentration:Fair  Attention Span:Fair  Recall:Fair  Fund of Knowledge:Fair  Language:Good   Psychomotor Activity  Psychomotor Activity:No data recorded  Assets  Assets:Communication Skills; Financial Resources/Insurance; Desire for Improvement; Housing; Resilience; Social Support; Vocational/Educational  Sleep  Sleep:No data recorded  Physical Exam: Physical Exam Vitals and nursing note reviewed.  HENT:     Head: Normocephalic.  Pulmonary:     Effort: Pulmonary effort is normal.  Musculoskeletal:        General: Normal range of motion.     Cervical back: Normal range of motion.  Neurological:     General: No focal deficit present.     Mental Status: He is alert.   Review of Systems  Constitutional: Negative.  Negative for fever.  HENT: Negative.  Negative for congestion.   Respiratory: Negative.  Negative for cough and shortness of breath.   Cardiovascular: Negative.  Negative for chest pain.  Gastrointestinal: Negative.  Negative for diarrhea, nausea and vomiting.  Genitourinary: Negative.   Musculoskeletal: Negative.   Neurological:  Positive for headaches.   Blood pressure (!) 104/50, pulse 70, temperature 98.1 F (36.7 C), temperature source Oral, resp. rate 16, height 6' (1.829 m), weight 70.3 kg, SpO2 98 %. Body mass index is 21.02 kg/m.   Treatment Plan Summary: Daily contact with patient to assess and evaluate symptoms and progress in treatment and Medication management  Labs reviewed 10/15:  Lipid profile with cholesterol 273 and LDL 212. A1c 4.8   EKG results 10/15:  Vent. rate 62 BPM PR interval 168 ms QRS duration 102 ms QT/QTcB 438/444 ms P-R-T axes 73 77 98 Normal sinus rhythm Cannot rule out Inferior infarct , age undetermined Anterior infarct , age undetermined T wave abnormality, consider lateral ischemia Abnormal ECG  Compared with EKG from [facility] on  05/26/2021: Diagnosis Class Abnormal  Acquisition Device D3K  Ventricular Rate 71  Atrial Rate 71  P-R Interval 168  QRS Duration 102  Q-T Interval 394  QTC Calculation(Bazett) 428  Calculated P Axis 64  Calculated R Axis 75  Calculated T Axis 54   Diagnosis Normal sinus rhythm  Cannot rule out Inferior infarct (cited on or before 07-Jan-2018)  Anteroseptal infarct (cited on or before 12-Nov-2014)  Abnormal ECG  When compared with ECG of 18-Apr-2021 11:04,  No significant change was found   Alcohol/benzodiazepine withdrawal symptoms.  CIWA Ativan taper complete.    Bipolar disorder.  Continue Olanzapine to 10 mg PO QHS   Major depression. Continue Fluoxetine 20 mg PO daily, increased on 10/16, then    Anxiety. Continue Vistaril 25 mg PO every 6 hrs prn.   Insomnia.  Continue Trazodone 50 mg po Q bedtime.  Continue agitation protocol: Haldol 5 mg PO or IM    Other prn medication: continue,  Acetaminophen 650 mg PO every 6 hrs prn for pain/fever. Zofran-ODT 4 mg PO every 6 hrs prn for N/V. Thiamine 100 mg PO daily for thiamine replacement. Multivitamin 1 tab PO daily for nutritional supplementation.   Nicotine withdrawal symptoms. Continue nicotine patch 14 mg  transdermal every 24 hours.   Continue every 15 minute safety checks Encourage participation in the therapeutic milieu Discharge planning in progress - Patient  will need PCP appointment for follow up for hyperlipidemia and cardiology appointment or referral for cardiomyopathy and anticoagulant therapy   Laveda Abbe, NP 06/06/2021, 4:55 PM

## 2021-06-06 NOTE — [facility] Group Notes (Signed)
Patient did not attend the Psycho-Ed group. 

## 2021-06-06 NOTE — Progress Notes (Addendum)
ANTICOAGULATION CONSULT NOTE - Initial Consult  Pharmacy Consult for Lovenox/coumadin   Allergies  Allergen Reactions   Quetiapine Fumarate     Other reaction(s): Confusion   Ketorolac Tromethamine Rash   Lactose Intolerance (Gi) Diarrhea    Patient Measurements: Pt wt 168lbs (76kg)   Vital Signs: Temp: 98.1 F (36.7 C) (10/17 0636) Temp Source: Oral (10/17 0636) BP: 116/64 (10/17 1100) Pulse Rate: 60 (10/17 1100)  Labs: Recent Labs    06/05/21 1838  LABPROT 13.0  INR 1.0  CREATININE 1.31*    Estimated Creatinine Clearance: 58.9 mL/min (A) (by C-G formula based on SCr of 1.31 mg/dL (H)).   Medical History: History reviewed. No pertinent past medical history.  Medications:  Scheduled:   multivitamin with minerals  1 tablet Oral Daily   nicotine  14 mg Transdermal Daily   OLANZapine  10 mg Oral QHS   [START ON 06/07/2021] sertraline  25 mg Oral Daily   Followed by   Melene Muller ON 06/08/2021] sertraline  50 mg Oral Daily   thiamine  100 mg Oral Daily    Assessment: Pt will be restarted on coumadin per medical consult recommendation.  Will bridge with lovenox.  Patient is agreeable and willing to start warfarin.    Goal of Therapy:  INR 2-3 Monitor platelets by anticoagulation protocol: Yes   Plan:  Lovenox 75 mg SQ q12 until INR >2.0 Coumadin  7.5 mg x 1 today  pt/inr daily   Charyl Dancer 06/06/2021,2:24 PM

## 2021-06-06 NOTE — Group Note (Signed)
Recreation Therapy Group Note   Group Topic:Stress Management  Group Date: 06/06/2021 Start Time: 0935 End Time: 0950 Facilitators: Caroll Rancher, LRT/CTRS Location: [address] Dayroom  Goal Area(s) Addresses:  Patient will actively participate in stress management techniques presented during session.  Patient will successfully identify benefit of practicing stress management post d/c.   Group Description: Guided Imagery. LRT provided education, instruction, and demonstration on practice of visualization via guided imagery. Patient was asked to participate in the technique introduced during session. LRT debriefed including topics of mindfulness, stress management and specific scenarios each patient could use these techniques. Patients were given suggestions of ways to access scripts post d/c and encouraged to explore Youtube and other apps available on smartphones, tablets, and computers.   Affect/Mood: N/A   Participation Level: Did not attend    Clinical Observations/Individualized Feedback: Pt did not attend group.    Plan: Continue to engage patient in RT group sessions 2-3x/week.   Caroll Rancher, LRT/CTRS 06/06/2021 12:47 PM

## 2021-06-06 NOTE — Group Note (Signed)
LCSW Group Therapy Note   Group Date: 06/06/2021 Start Time: 1300 End Time: 1400   Type of Therapy and Topic:  Group Therapy:   Participation Level:  None  Patients received a worksheet with an outline of 2 gingerbread men with a separation in the middle of the page. One sign designated what the pt sees about themselves and the other is what others see. Pts were asked to introduce themselves and share something they like about themself. Pts were then asked to draw, write or color how they view themselves as well as how they are viewed by others. CSW led discussion about the feelings and words associated with each side.   Patient Summary:   Pt came to group late and did not share anything.  Pt did accept the worksheet that was provided.   Aram Beecham, LCSWA 06/06/2021  1:49 PM

## 2021-06-06 NOTE — B[facility] Counselor (Signed)
CSW met with pt to discuss aftercare resources. Pt stated that he is is interested in residential substance use treatment at discharge.   Toney Reil, [place] Worker [employer]

## 2021-06-06 NOTE — Progress Notes (Addendum)
D: Patient is currently in his room sleeping. He did come up for his morning medications. Patient continues to be focused on haldol and ativan. His detox taper ended this am. Patient has been encouraged to attend group and interact with his peers. He did stay out of his room yesterday and spoke with several of his peers. Patient is not to be locked out of his room. He states he continues to have self-harm thoughts with no specific plan. He reports auditory command hallucinations. Patient came up to nurse's station to inquire about his "coumadin medication." Advised MD and pharmacy. He is ambulating well and presents with improved affect today.  A: Continue to monitor medication management and MD orders.  Safety checks completed every 15 minutes per protocol.  Offer support and encouragement as needed.  R: Patient is receptive to staff; he remains isolative and withdrawn.   06/06/21 0800  Psych Admission Type (Psych Patients Only)  Admission Status Voluntary  Psychosocial Assessment  Patient Complaints Anxiety  Eye Contact Brief  Facial Expression Sullen  Affect Depressed  Speech Soft  Interaction Forwards little  Motor Activity Slow  Appearance/Hygiene Unremarkable  Behavior Characteristics Cooperative  Mood Depressed  Thought Process  Coherency WDL  Content WDL  Delusions None reported or observed  Perception WDL  Hallucination Auditory;Command  Judgment Poor  Confusion None  Danger to Self  Current suicidal ideation? Passive  Self-Injurious Behavior No self-injurious ideation or behavior indicators observed or expressed   Danger to Others  Danger to Others None reported or observed

## 2021-06-06 NOTE — Progress Notes (Signed)
   06/06/21 2007  Psych Admission Type (Psych Patients Only)  Admission Status Voluntary  Psychosocial Assessment  Patient Complaints Anxiety;Substance abuse  Eye Contact Brief  Facial Expression Sullen  Affect Depressed  Speech Soft  Interaction Forwards little;Needy  Motor Activity Slow  Appearance/Hygiene Unremarkable  Behavior Characteristics Cooperative;Appropriate to situation;Anxious  Mood Anxious  Thought Process  Coherency WDL  Content WDL  Delusions None reported or observed  Perception WDL  Hallucination Auditory;Command (voices telling him to hurt himself; pt knows they are not real)  Judgment Poor  Confusion None  Danger to Self  Current suicidal ideation? Passive  Self-Injurious Behavior No self-injurious ideation or behavior indicators observed or expressed   Agreement Not to Harm Self Yes  Description of Agreement verbal  Danger to Others  Danger to Others None reported or observed   Pt seen in his room laying on the bed. Pt endorses passive SI but contracts for safety. Pt denies HI, VH. Pt endorses command AH telling him to hurt himself. Pt has not acted on them because he knows the voices are not real. Pt first denies pain but then says he has a headache rated 8/10. "I'm not going to group because the lights hurt my eyes." Pt rates anxiety 8/10 and denies depression. Pt says his headache is a withdrawal symptom. Pt also c/o upset stomach.

## 2021-06-06 NOTE — Group Note (Signed)
Occupational Therapy Group Note  Group Topic:Communication  Group Date: 06/06/2021 Start Time: 1400 End Time: 1445 Facilitators: Muskan Bolla, OT/L   Group Description: Group encouraged increased engagement and participation through discussion focused on communication styles. Patients were educated on the different styles of communication including passive, aggressive, assertive, and passive-aggressive communication. Group members shared and reflected on which styles they most often find themselves communicating in and brainstormed strategies on how to transition and practice a more assertive approach. Further discussion explored how to use assertiveness skills and strategies to further advocate and ask questions as it relates to their treatment plan and mental health.   Therapeutic Goal(s): Identify practical strategies to improve communication skills  Identify how to use assertive communication skills to address individual needs and wants   Participation Level: OT Group not held d/t high volume of individual OT/PT orders that needed to be seen.    Plan: Continue to engage patient in OT groups 2 - 3x/week.  06/06/2021  Anali Cabanilla, OT/L   

## 2021-06-06 NOTE — Progress Notes (Signed)
   06/06/21 0136  Psych Admission Type (Psych Patients Only)  Admission Status Voluntary  Psychosocial Assessment  Patient Complaints Anxiety;Self-harm behaviors  Eye Contact Brief  Facial Expression Blank  Affect Depressed  Speech Soft  Interaction Forwards little  Motor Activity Slow  Appearance/Hygiene Unremarkable  Behavior Characteristics Cooperative  Mood Depressed;Anxious  Thought Process  Coherency WDL  Content WDL  Delusions None reported or observed  Perception WDL  Hallucination Auditory;Command  Judgment Poor  Confusion None  Danger to Self  Current suicidal ideation? Passive  Self-Injurious Behavior No self-injurious ideation or behavior indicators observed or expressed   Danger to Others  Danger to Others None reported or observed  D: Patient ic/o multiple withdrawal symptoms and auditory hallucination telling him to hurt himself. Pt asked for medication to help calm his nerves.  A: Medications administered as prescribed. Support and encouragement provided as needed.  R: Patient remains safe on the unit. Will continue to monitor for safety and stability.

## 2021-06-07 DIAGNOSIS — F319 Bipolar disorder, unspecified: Secondary | ICD-10-CM | POA: Diagnosis not present

## 2021-06-07 LAB — RENAL FUNCTION PANEL
Albumin: 4.2 g/dL (ref 3.5–5.0)
Anion gap: 8 (ref 5–15)
BUN: 19 mg/dL (ref 8–23)
CO2: 25 mmol/L (ref 22–32)
Calcium: 9.3 mg/dL (ref 8.9–10.3)
Chloride: 106 mmol/L (ref 98–111)
Creatinine, Ser: 0.9 mg/dL (ref 0.61–1.24)
GFR, Estimated: >60 mL/min (ref >60)
Glucose, Bld: 103 mg/dL — ABNORMAL HIGH (ref 70–99)
Phosphorus: 3.2 mg/dL (ref 2.5–4.6)
Potassium: 4 mmol/L (ref 3.5–5.1)
Sodium: 139 mmol/L (ref 135–145)

## 2021-06-07 MED ORDER — DOCUSATE SODIUM 100 MG PO CAPS
100.0000 mg | ORAL_CAPSULE | Freq: Every day | ORAL | Status: DC | PRN
  Administered 2021-06-07: 100 mg via ORAL
  Filled 2021-06-07: qty 1

## 2021-06-07 MED ORDER — TRAZODONE HCL 150 MG PO TABS
150.0000 mg | ORAL_TABLET | Freq: Every evening | ORAL | Status: DC | PRN
  Administered 2021-06-07 – 2021-06-08 (×4): 150 mg via ORAL
  Filled 2021-06-07 (×9): qty 1

## 2021-06-07 MED ORDER — OLANZAPINE 2.5 MG PO TABS
2.5000 mg | ORAL_TABLET | Freq: Every day | ORAL | Status: DC
  Administered 2021-06-07 – 2021-06-09 (×3): 2.5 mg via ORAL
  Filled 2021-06-07 (×6): qty 1

## 2021-06-07 MED ORDER — WARFARIN SODIUM 7.5 MG PO TABS
7.5000 mg | ORAL_TABLET | Freq: Once | ORAL | Status: AC
  Administered 2021-06-07: 7.5 mg via ORAL
  Filled 2021-06-07: qty 1

## 2021-06-07 NOTE — Progress Notes (Signed)
ANTICOAGULATION CONSULT NOTE - Follow Up Consult  Pharmacy Consult for coumadin/lovenox   Allergies  Allergen Reactions   Quetiapine Fumarate     Other reaction(s): Confusion   Ketorolac Tromethamine Rash   Lactose Intolerance (Gi) Diarrhea    Patient Measurements: Wt 75kg   Vital Signs:    Labs: Recent Labs    06/05/21 1838 06/07/21 0642  LABPROT 13.0  --   INR 1.0  --   CREATININE 1.31* 0.90    Estimated Creatinine Clearance: 85.7 mL/min (by C-G formula based on SCr of 0.9 mg/dL).   Assessment: No bleeding noted in chart  Goal of Therapy:  INR 2-3 Monitor platelets by anticoagulation protocol: Yes   Plan:  Coumadin 7.5 x 1 again today  PT/INR in am  Continue lovenox   Charyl Dancer 06/07/2021,7:44 AM

## 2021-06-07 NOTE — Progress Notes (Signed)
Pt reports increased racing thoughts, increased anxiety and trouble falling asleep last night. Isolative to room majority of this shift, OOB for medication only. Pt refused to attend all scheduled groups when prompted by nursing staff "I'm too tired from not sleeping last night and I'm still having racing thoughts. I'm not going in there". Observed with flat affect, depressed mood, irritable with fair eye contact on interactions. Denies SI, HI, AVH and pain when assessed. Received PRN Vistaril and mylanta as ordered (see emar) and reported relief when reassessed. Q 15 minutes safety checks maintained without self harm gestures. Verbal education given on all scheduled medications and effects monitored. Support, encouragement and reassurance offered to pt this shift. New order received for increased Trazodone 150 mg and Zyprexa 2.5 mg (see emar) this shift to address pt's concerns of sleep disturbance and continued racing thoughts.  Pt remains safe on the unit. Denies concerns at this time.

## 2021-06-07 NOTE — Progress Notes (Signed)
[facility] MD Progress Note  06/07/2021 3:28 PM Phillip Marquez  MRN:  [id number] Subjective:  Patient is a 60 year old Caucasian male with hx of Bipolar disorder, current episode depressed, alcohol use disorder, benzodiazepine use disorder & cocaine use disorder. He has been a regular patient in another psychiatric hospital x multiple times all related to substance use & bipolar disorder symptoms.  Phillip Marquez is admitted to [facility] from the [facility] hospital with complaints of worsening alcohol use, benzodiazepine use & suicidal ideations with a plan to drive his car into a bridge. His UDS was positive for benzodiazepine & has a BAL of <10.   Evaluation on the unit today, 10/18: pt case discussed in multidisciplinary team meeting this morning.  Pt reports that mood is down and depressed. Reports racing thoughts bother him. Reports not feeling like he is withdrawing from alcohol or benzos any longer.  Reports anxiety is elevated. Reports AH bother him and make him have passive suicidal thoughts.  Appetite is stable. Denies s/e to current psychiatric medications.  Asks for stool softener.   See MAR for Lovenox to Coumadin bridge dosing.   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 (See 04/06/2021 [facility] in chart review). He was recommended for anticoagulant therapy and was bridged from Lovenox to Coumadin, however he did not follow up after he was discharged and he continues to drink heavily. Medicine was consulted (see note in chart for recommendations), and  discussed his options with him and the risks and benefits of anticoagulation versus doing nothing he stated he will think about what he wants to do. He stated he did not take it seriously in August.    Principal Problem: Bipolar depression (HCC) Diagnosis: Principal Problem:   Bipolar depression (HCC) Active Problems:   Alcohol use disorder, severe, dependence (HCC)   Severe  benzodiazepine use disorder (HCC)  Total Time spent with patient: 20 minutes  Past Psychiatric History:  Yes, Bipolar disorder, alcohol use disorder, cocaine use disorder.  Past Medical History: History reviewed. No pertinent past medical history. History reviewed. No pertinent surgical history. Family History: History reviewed. No pertinent family history. Family Psychiatric  History: Bipolar disorder: Sister Social History:  Social History   Substance and Sexual Activity  Alcohol Use None     Social History   Substance and Sexual Activity  Drug Use Not on file    Social History   Socioeconomic History   Marital status: Divorced    Spouse name: Not on file   Number of children: Not on file   Years of education: Not on file   Highest education level: Not on file  Occupational History   Not on file  Tobacco Use   Smoking status: Every Day    Packs/day: 1.00    Years: 10.00    Pack years: 10.00    Types: Cigarettes   Smokeless tobacco: Never  Substance and Sexual Activity   Alcohol use: Not on file   Drug use: Not on file   Sexual activity: Not on file  Other Topics Concern   Not on file  Social History Narrative   Not on file   Social Determinants of Health   Financial Resource Strain: Not on file  Food Insecurity: Not on file  Transportation Needs: Not on file  Physical Activity: Not on file  Stress: Not on file  Social Connections: Not on file   Additional Social History:  Divorced, has 1 son, employed, from [place], but lives in [place], [place].  Sleep: Fair  Appetite:  Fair  Current Medications: Current Facility-Administered Medications  Medication Dose Route Frequency Provider Last Rate Last Admin   acetaminophen (TYLENOL) tablet 650 mg  650 mg Oral Q6H PRN Comer Locket, MD   650 mg at 06/06/21 2014   alum & mag hydroxide-simeth (MAALOX/MYLANTA) 200-200-20 MG/5ML suspension 30 mL  30 mL Oral Q4H PRN Melbourne Abts W, PA-C   30 mL at  06/06/21 2130   docusate sodium (COLACE) capsule 100 mg  100 mg Oral Daily PRN Timarion Agcaoili, Harrold Donath, MD       enoxaparin (LOVENOX) injection 75 mg  75 mg Subcutaneous Q12H Mason Jim, Amy E, MD   75 mg at 06/07/21 0617   hydrOXYzine (ATARAX/VISTARIL) tablet 25 mg  25 mg Oral Q6H PRN Comer Locket, MD   25 mg at 06/07/21 1408   LORazepam (ATIVAN) tablet 1 mg  1 mg Oral Q6H PRN Bartholomew Crews E, MD   1 mg at 06/05/21 2358   magnesium hydroxide (MILK OF MAGNESIA) suspension 30 mL  30 mL Oral Daily PRN Melbourne Abts W, PA-C       multivitamin with minerals tablet 1 tablet  1 tablet Oral Daily Mason Jim, Amy E, MD   1 tablet at 06/03/21 4174   nicotine (NICODERM CQ - dosed in mg/24 hours) patch 14 mg  14 mg Transdermal Daily Nira Conn A, NP       OLANZapine (ZYPREXA) tablet 10 mg  10 mg Oral QHS Laveda Abbe, NP   10 mg at 06/06/21 2130   OLANZapine (ZYPREXA) tablet 2.5 mg  2.5 mg Oral Daily Zenith Lamphier, MD       ondansetron (ZOFRAN-ODT) disintegrating tablet 4 mg  4 mg Oral Q6H PRN Comer Locket, MD       [START ON 06/08/2021] sertraline (ZOLOFT) tablet 50 mg  50 mg Oral Daily Laveda Abbe, NP       thiamine tablet 100 mg  100 mg Oral Daily Ophelia Shoulder E, NP   100 mg at 06/03/21 0814   traZODone (DESYREL) tablet 150 mg  150 mg Oral QHS,MR X 1 Bridger Pizzi, MD       warfarin (COUMADIN) tablet 7.5 mg  7.5 mg Oral ONCE-1600 Comer Locket, MD       Warfarin - Pharmacist Dosing Inpatient   Does not apply [id number] Comer Locket, MD   Given at 06/06/21 1607    Lab Results:  Results for orders placed or performed during the hospital encounter of 06/02/21 (from the past 48 hour(s))  TSH     Status: None   Collection Time: 06/05/21  6:38 PM  Result Value Ref Range   TSH 0.404 0.350 - 4.500 uIU/mL    Comment: Performed by a 3rd Generation assay with a functional sensitivity of <=0.01 uIU/mL. Performed at [facility], [address],  [place], [place] [address]   Protime-INR     Status: None   Collection Time: 06/05/21  6:38 PM  Result Value Ref Range   Prothrombin Time 13.0 11.4 - 15.2 seconds   INR 1.0 0.8 - 1.2    Comment: (NOTE) INR goal varies based on device and disease states. Performed at [facility], [address], [place], [place] [address]   Renal function panel     Status: Abnormal   Collection Time: 06/05/21  6:38 PM  Result Value Ref  Range   Sodium 141 135 - 145 mmol/L   Potassium 3.7 3.5 - 5.1 mmol/L   Chloride 106 98 - 111 mmol/L   CO2 27 22 - 32 mmol/L   Glucose, Bld 121 (H) 70 - 99 mg/dL    Comment: Glucose reference range applies only to samples taken after fasting for at least 8 hours.   BUN 26 (H) 8 - 23 mg/dL   Creatinine, Ser 9.37 (H) 0.61 - 1.24 mg/dL   Calcium 9.1 8.9 - 16.9 mg/dL   Phosphorus 3.1 2.5 - 4.6 mg/dL   Albumin 4.2 3.5 - 5.0 g/dL   GFR, Estimated >67 >89 mL/min    Comment: (NOTE) Calculated using the CKD-EPI Creatinine Equation (2021)    Anion gap 8 5 - 15    Comment: Performed at [facility], [address], [place], [place] [address]  Urinalysis, Complete w Microscopic Urine, Random     Status: Abnormal   Collection Time: 06/05/21  7:04 PM  Result Value Ref Range   Color, Urine YELLOW YELLOW   APPearance CLEAR CLEAR   Specific Gravity, Urine 1.020 1.005 - 1.030   pH 5.0 5.0 - 8.0   Glucose, UA NEGATIVE NEGATIVE mg/dL   Hgb urine dipstick NEGATIVE NEGATIVE   Bilirubin Urine NEGATIVE NEGATIVE   Ketones, ur NEGATIVE NEGATIVE mg/dL   Protein, ur NEGATIVE NEGATIVE mg/dL   Nitrite NEGATIVE NEGATIVE   Leukocytes,Ua NEGATIVE NEGATIVE   RBC / HPF 0-5 0 - 5 RBC/hpf   WBC, UA 0-5 0 - 5 WBC/hpf   Bacteria, UA RARE (A) NONE SEEN   Squamous Epithelial / LPF 0-5 0 - 5   Mucus PRESENT     Comment: Performed at [facility], [address], [place], [place] [address]  Renal function panel     Status: Abnormal    Collection Time: 06/07/21  6:42 AM  Result Value Ref Range   Sodium 139 135 - 145 mmol/L   Potassium 4.0 3.5 - 5.1 mmol/L   Chloride 106 98 - 111 mmol/L   CO2 25 22 - 32 mmol/L   Glucose, Bld 103 (H) 70 - 99 mg/dL    Comment: Glucose reference range applies only to samples taken after fasting for at least 8 hours.   BUN 19 8 - 23 mg/dL   Creatinine, Ser 5.85 0.61 - 1.24 mg/dL   Calcium 9.3 8.9 - 27.7 mg/dL   Phosphorus 3.2 2.5 - 4.6 mg/dL   Albumin 4.2 3.5 - 5.0 g/dL   GFR, Estimated >82 >42 mL/min    Comment: (NOTE) Calculated using the CKD-EPI Creatinine Equation (2021)    Anion gap 8 5 - 15    Comment: Performed at [facility], [address] [place], [place] [address]    Blood Alcohol level:  Lab Results  Component Value Date   ETH <10 05/31/2021    Metabolic Disorder Labs: Lab Results  Component Value Date   HGBA1C 4.8 06/04/2021   MPG 91.06 06/04/2021   No results found for: PROLACTIN Lab Results  Component Value Date   CHOL 273 (H) 06/04/2021   TRIG 116 06/04/2021   HDL 38 (L) 06/04/2021   CHOLHDL 7.2 06/04/2021   VLDL 23 06/04/2021   LDLCALC 212 (H) 06/04/2021    Physical Findings: AIMS:  , ,  ,  ,    CIWA:  CIWA-Ar Total: 2 COWS:     Musculoskeletal: Strength & Muscle Tone: within normal limits Gait & Station:  normal Patient leans: N/A  Psychiatric Specialty Exam:  Presentation  General Appearance: Disheveled  Eye Contact:Fleeting  Speech:Normal Rate  Speech Volume:Decreased  Handedness:Right   Mood and Affect  Mood:Anxious; Depressed; Dysphoric; Hopeless  Affect:Congruent; Constricted; Depressed   Thought Process  Thought Processes:Linear; Coherent  Descriptions of Associations:Intact  Orientation:Full (Time, Place and Person)  Thought Content:Logical  History of Schizophrenia/Schizoaffective disorder:No data recorded Duration of Psychotic Symptoms:No data recorded Hallucinations:No data  recorded  Ideas of Reference:None  Suicidal Thoughts: reports passive suicidal thoughts. Denies intent or plan.   Homicidal Thoughts: denies   Sensorium  Memory:Immediate Fair; Recent Fair; Remote Fair  Judgment:Poor  Insight:Poor   Executive Functions  Concentration:Fair  Attention Span:Fair  Recall:Fair  [employer] of Knowledge:Fair  Language:Good   Psychomotor Activity  Psychomotor Activity:No data recorded  Assets  Assets:Communication Skills; Financial Resources/Insurance; Desire for Improvement; Housing; Resilience; Social Support; Vocational/Educational  Sleep  Sleep:No data recorded  Physical Exam: Physical Exam Vitals and nursing note reviewed.  Constitutional:      General: He is not in acute distress.    Appearance: He is normal weight. He is not ill-appearing or toxic-appearing.  HENT:     Head: Normocephalic.  Pulmonary:     Effort: Pulmonary effort is normal.  Musculoskeletal:        General: Normal range of motion.     Cervical back: Normal range of motion.  Neurological:     General: No focal deficit present.     Mental Status: He is alert.     Motor: No weakness.   Review of Systems  Constitutional: Negative.  Negative for fever.  HENT: Negative.  Negative for congestion.   Respiratory: Negative.  Negative for cough and shortness of breath.   Cardiovascular: Negative.  Negative for chest pain.  Gastrointestinal: Negative.  Negative for diarrhea, nausea and vomiting.  Genitourinary: Negative.   Musculoskeletal: Negative.   Neurological:  Positive for headaches.  Psychiatric/Behavioral:  Positive for depression, hallucinations and suicidal ideas. The patient is nervous/anxious.   All other systems reviewed and are negative.  Blood pressure 123/86, pulse 73, temperature 97.8 F (36.6 C), resp. rate 16, height 6' (1.829 m), weight 70.3 kg, SpO2 99 %. Body mass index is 21.02 kg/m.   Treatment Plan Summary: Daily contact with patient to  assess and evaluate symptoms and progress in treatment and Medication management  Labs reviewed 10/15:  Lipid profile with cholesterol 273 and LDL 212. A1c 4.8   EKG results 10/15:  Vent. rate 62 BPM PR interval 168 ms QRS duration 102 ms QT/QTcB 438/444 ms P-R-T axes 73 77 98 Normal sinus rhythm Cannot rule out Inferior infarct , age undetermined Anterior infarct , age undetermined T wave abnormality, consider lateral ischemia Abnormal ECG  Compared with EKG from [facility] on 05/26/2021: Diagnosis Class Abnormal  Acquisition Device D3K  Ventricular Rate 71  Atrial Rate 71  P-R Interval 168  QRS Duration 102  Q-T Interval 394  QTC Calculation(Bazett) 428  Calculated P Axis 64  Calculated R Axis 75  Calculated T Axis 54   Diagnosis Normal sinus rhythm  Cannot rule out Inferior infarct (cited on or before 07-Jan-2018)  Anteroseptal infarct (cited on or before 12-Nov-2014)  Abnormal ECG  When compared with ECG of 18-Apr-2021 11:04,  No significant change was found   Alcohol/benzodiazepine withdrawal symptoms.  CIWA Ativan taper complete.    Bipolar disorder - current episode depressed  START zyprexa 2.5 mg once daily in the morning for racing thoughts and  anxiety during the day  Continue Olanzapine to 10 mg PO QHS Continue Sertraline 50 mg once daily  Continue Vistaril 25 mg PO every 6 hrs prn. INCREASE Trazodone to 150 mg po Q bedtime.    Other prn medication:  START colace for stool softener  Acetaminophen 650 mg PO every 6 hrs prn for pain/fever. Zofran-ODT 4 mg PO every 6 hrs prn for N/V. Thiamine 100 mg PO daily for thiamine replacement. Multivitamin 1 tab PO daily for nutritional supplementation.   Nicotine withdrawal symptoms. Continue nicotine patch 14 mg  transdermal every 24 hours.   Continue every 15 minute safety checks Encourage participation in the therapeutic milieu Discharge planning in progress - Patient will need PCP appointment for follow  up for hyperlipidemia and cardiology appointment or referral for cardiomyopathy and anticoagulant therapy    Total Time Spent in Direct Patient Care:  I personally spent 30 minutes on the unit in direct patient care. The direct patient care time included face-to-face time with the patient, reviewing the patient's chart, communicating with other professionals, and coordinating care. Greater than 50% of this time was spent in counseling or coordinating care with the patient regarding goals of hospitalization, psycho-education, and discharge planning needs.   Phineas Inches, MD Psychiatrist    Ardeth Perfect Tyjai Matuszak, MD 06/07/2021, 3:28 PM

## 2021-06-07 NOTE — Group Note (Signed)
Recreation Therapy Group Note   Group Topic:Animal Assisted Therapy   Group Date: 06/07/2021 Start Time: 1430 End Time: 1510 Facilitators: Caroll Rancher, LRT/CTRS Location: [address]  AAA/T Program Assumption of Risk Form signed by Patient/ or Parent Legal Guardian Yes  Patient understands his/her participation is voluntary Yes   Affect/Mood: N/A   Participation Level: Did not attend    Clinical Observations/Individualized Feedback: Pt did not attend group.    Plan: Continue to engage patient in RT group sessions 2-3x/week.   Caroll Rancher, LRT/CTRS  06/07/2021 4:03 PM

## 2021-06-07 NOTE — BHH Group Notes (Signed)
The focus of this group is to help patients establish daily goals to achieve during treatment and discuss how the patient can incorporate goal setting into their daily lives to aide in recovery.  Pt did not attend morning goals group.  

## 2021-06-07 NOTE — BHH Counselor (Signed)
CSW gave pt the number to [employer] to complete his prescreen.  Phillip Marquez, LCSWA Clinicial Social Worker [employer]

## 2021-06-07 NOTE — Progress Notes (Signed)
Adult Psychoeducational Group Note  Date:  06/07/2021 Time:  9:22 PM  Group Topic/Focus:  Wrap-Up Group:   The focus of this group is to help patients review their daily goal of treatment and discuss progress on daily workbooks.  Participation Level:  Minimal  Participation Quality:  Appropriate  Affect:  Anxious  Cognitive:  Disorganized  Insight: Limited  Engagement in Group:  Limited  Modes of Intervention:  Discussion  Additional Comments:  Pt stated his goal for today was to focus on his treatment plan. Pt stated he accomplished his goal today. Pt stated he talked with his doctor and social worker about his care today. Pt rated his overall day a 5 out of 10. Pt stated he made calls to his brother today which improved his overall day. Pt stated he felt better about himself today. Pt stated he was able to attend all meals. Pt stated he took all medications provided today. Pt stated he attend all groups held today. Pt stated his appetite was pretty good today. Pt rated sleep last night was fair. Pt stated the goal tonight was to get some rest. Pt stated he had no physical pain today. Pt deny visual hallucinations tonight. Pt admitted to dealing with auditory issues tonight. Pt nurse was updated on situation. Pt denies thoughts of harming others. Pt admitted to dealing with thoughts of harming himself. Pt stated he could contract for safety. Pt nurse was updated on situation. Pt stated he would alert staff if anything changed.  Felipa Furnace 06/07/2021, 9:22 PM

## 2021-06-08 ENCOUNTER — Encounter (HOSPITAL_COMMUNITY): Payer: Self-pay

## 2021-06-08 DIAGNOSIS — F3160 Bipolar disorder, current episode mixed, unspecified: Secondary | ICD-10-CM | POA: Diagnosis not present

## 2021-06-08 LAB — PROTIME-INR
INR: 1.4 — ABNORMAL HIGH (ref 0.8–1.2)
Prothrombin Time: 16.6 seconds — ABNORMAL HIGH (ref 11.4–15.2)

## 2021-06-08 MED ORDER — ENOXAPARIN SODIUM 80 MG/0.8ML IJ SOSY
75.0000 mg | PREFILLED_SYRINGE | Freq: Two times a day (BID) | INTRAMUSCULAR | Status: DC
  Administered 2021-06-08 – 2021-06-13 (×10): 75 mg via SUBCUTANEOUS
  Filled 2021-06-08 (×12): qty 0.8

## 2021-06-08 MED ORDER — HYDROXYZINE HCL 25 MG PO TABS
25.0000 mg | ORAL_TABLET | Freq: Three times a day (TID) | ORAL | Status: DC | PRN
  Administered 2021-06-08 – 2021-06-14 (×9): 25 mg via ORAL
  Filled 2021-06-08 (×3): qty 1
  Filled 2021-06-08: qty 10
  Filled 2021-06-08 (×6): qty 1

## 2021-06-08 MED ORDER — WARFARIN SODIUM 5 MG PO TABS
5.0000 mg | ORAL_TABLET | Freq: Once | ORAL | Status: AC
  Administered 2021-06-08: 5 mg via ORAL
  Filled 2021-06-08: qty 1

## 2021-06-08 NOTE — Progress Notes (Signed)
   06/07/21 2050  Psych Admission Type (Psych Patients Only)  Admission Status Voluntary  Psychosocial Assessment  Patient Complaints Anxiety;Depression  Eye Contact Fair  Facial Expression Anxious  Affect Appropriate to circumstance  Speech Soft;Logical/coherent  Interaction Needy;Forwards little  Motor Activity Slow  Appearance/Hygiene Unremarkable;Improved  Behavior Characteristics Cooperative;Anxious  Mood Anxious;Depressed  Thought Process  Coherency WDL  Content WDL  Delusions None reported or observed  Perception WDL  Hallucination Auditory;Command  Judgment Poor  Confusion None  Danger to Self  Current suicidal ideation? Passive  Self-Injurious Behavior No self-injurious ideation or behavior indicators observed or expressed   Agreement Not to Harm Self Yes  Description of Agreement verbal  Danger to Others  Danger to Others None reported or observed   Pt seen in dayroom. Pt endorses passive SI but contracts for safety. Pt denies HI and pain. Pt endorses command auditory hallucinations. Pt still knows the voices are not real. Pt says he was going for a change today. "I shaved and showered. Trying to do a new thing." Pt commended for his efforts to improve his mental outlook. Pt rates anxiety 5/10 and depression 6/10. Pt says that he still has anxiety and racing thoughts. Says that the voices are just the same as when he was first admitted - they are not getting any better. Pt endorsing poor sleep that is not restful.

## 2021-06-08 NOTE — Group Note (Signed)
[facility] LCSW Group Therapy Note   Group Date: 06/08/2021 Start Time: 1300 End Time: 1400   Type of Therapy/Topic:  Group Therapy:  Emotion Regulation  Participation Level:  Active   Mood:  Description of Group:    The purpose of this group is to assist patients in learning to regulate negative emotions and experience positive emotions. Patients will be guided to discuss ways in which they have been vulnerable to their negative emotions. These vulnerabilities will be juxtaposed with experiences of positive emotions or situations, and patients challenged to use positive emotions to combat negative ones. Special emphasis will be placed on coping with negative emotions in conflict situations, and patients will process healthy conflict resolution skills.  Therapeutic Goals: Patient will identify two positive emotions or experiences to reflect on in order to balance out negative emotions:  Patient will label two or more emotions that they find the most difficult to experience:  Patient will be able to demonstrate positive conflict resolution skills through discussion or role plays:   Summary of Patient Progress:  Pt stated that they are feeling anxious today.  The Pt accepted the handout of the emotion wheel and discussed various emotions about discharge and being in the hospital.  Pt was appropriate and participated in open discussion with their peers.     Therapeutic Modalities:   Cognitive Behavioral Therapy Feelings Identification Dialectical Behavioral Therapy   Aram Beecham, LCSWA

## 2021-06-08 NOTE — Plan of Care (Signed)
Nurse discussed anxiety, depression and coping skills with patient.  

## 2021-06-08 NOTE — Progress Notes (Signed)
Pt says he still cannot sleep. Given repeat PRN for insomnia.

## 2021-06-08 NOTE — BHH Group Notes (Signed)
The focus of this group is to help patients establish daily goals to achieve during treatment and discuss how the patient can incorporate goal setting into their daily lives to aide in recovery.  Pt did not attend morning goals group.  

## 2021-06-08 NOTE — Progress Notes (Signed)
Pt states that his sleep wasn't restful. Received Trazodone and repeat dose as well last night.

## 2021-06-08 NOTE — Group Note (Unsigned)
Group Topic: Goal Setting  Group Date: 06/08/2021 Start Time: 0900 End Time: 1000 Facilitators: Pearline Cables, NT  [facility]  Number of Participants: 10  Group Focus: daily focus Treatment Modality:  Psychoeducation Interventions utilized were orientation and reality testing Purpose: regain self-worth   Name: Tabias Swayze Date of Birth: 1961/04/15  MR: [id number]    Level of Participation: {THERAPIES; PSYCH GROUP PARTICIPATION [id number]:56387} Quality of Participation: {THERAPIES; PSYCH QUALITY OF PARTICIPATION:23992} Interactions with others: {THERAPIES; PSYCH INTERACTIONS:23993} Mood/Affect: {THERAPIES; PSYCH MOOD/AFFECT:23994} Triggers (if applicable): *** Cognition: {THERAPIES; PSYCH COGNITION:23995} Progress: {THERAPIES; PSYCH PROGRESS:23997} Response: *** Plan: {THERAPIES; PSYCH FIEP:32951}  Patients Problems:  Patient Active Problem List   Diagnosis Date Noted   Alcohol use disorder, severe, dependence (HCC) 06/03/2021   Bipolar depression (HCC) 06/03/2021   Severe benzodiazepine use disorder (HCC) 06/03/2021   Polysubstance abuse (HCC) 06/01/2021   Substance induced mood disorder (HCC) 06/01/2021

## 2021-06-08 NOTE — Progress Notes (Signed)
Pharmacy:  coumadin  Lab was unable to get blood this morning for INR.  Lab has rescheduled INR for this evening blood drawer.  Pt has received Coumadin 7.5 mg x 2.  When discharged from [facility] pt was ordered coumadin 5 mg but did not follow up.  Will give Coumadin 5 mg x 1 today.   Will review INR in am to determine the following dose. If lab is unable to access blood work consistently, may recommend to physician an additional medical consult.  Peggye Fothergill, [place] D

## 2021-06-08 NOTE — Progress Notes (Signed)
[facility] MD Progress Note  06/08/2021 11:06 AM Phillip Marquez  MRN:  [id number]  Subjective: Phillip Marquez reports, "I did not sleep well last night, so I'm tired this morning. That is the reason I'm in bed. I'm still feeling depressed & anxious because I don't get enough rest. I'm hearing voices telling me to hurt myself, but I feel safe here". Daily notes: Patient is seen. Chart reviewed. The chart findings discussed with the treatment team. He presents alert, oriented & aware of situation. He is lying down in his bed. Says he is feeling tired because he did not sleep well last night. However, chart review indicated he slept for 6.75 hrs. Patient did go to breakfast & had a good appetite this morning. He says he tries to attend as much group sessions as he could. He continues to complain of depressed mood, high anxiety levels. He is also endorsing passive SI without any plans or intent to hurt himself. He continues to endorse auditory hallucinations telling him to hurt himself. Patient is able to verbally contract for safety. He rates his depression #7 & anxiety #9, however, his affect, presentation & mannerism do not reflect his depression/anxiety rates.  He currently denies any HI, VH, delusional thoughts or paranoia. He does not appear to be responding to any internal stimuli. He is taking & tolerating his treatment regimen. Denies any side effects. Patient is encouraged to get out of bed & attend group sessions. Apparently the phlebotomist this am has problems drawing the PT-INR, blood drawn for PT-INR will be repeated this evening. Will continue patient's plan of care as already in progress. Vital signs: stable.  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 He has been a regular patient in another psychiatric hospital x multiple times all related to substance use & bipolar  disorder symptoms.  Phillip Marquez is admitted to [facility] from the [facility] hospital with complaints of worsening alcohol use, benzodiazepine use & suicidal ideations with a plan to drive his car into a bridge. His UDS was positive for benzodiazepine & has a BAL of <10.   See MAR for Lovenox to Coumadin bridge dosing.   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 (See 04/06/2021 [facility] in chart review). He was recommended for anticoagulant therapy and was bridged from Lovenox to Coumadin, however he did not follow up after he was discharged and he continues to drink heavily. Medicine was consulted (see note in chart for recommendations), and  discussed his options with him and the risks and benefits of anticoagulation versus doing nothing he stated he will think about what he wants to do. He stated he did not take it seriously in August.   Principal Problem: Bipolar depression (HCC)  Diagnosis: Principal Problem:   Bipolar depression (HCC) Active Problems:   Alcohol use disorder, severe, dependence (HCC)   Severe benzodiazepine use disorder (HCC)  Total Time spent with patient: 15 minutes  Past Psychiatric History:  Yes, Bipolar disorder, alcohol use disorder, cocaine use disorder.  Past Medical History: History reviewed. No pertinent past medical history. History reviewed. No pertinent surgical history.  Family History: History reviewed. No pertinent family history.  Family Psychiatric  History: Bipolar disorder: Sister  Social History:  Social History   Substance and Sexual Activity  Alcohol Use None  Social History   Substance and Sexual Activity  Drug Use Not on file    Social History   Socioeconomic History   Marital status: Divorced    Spouse name: Not on file   Number of children: Not on file   Years of education: Not on file   Highest education level: Not on file  Occupational History   Not on  file  Tobacco Use   Smoking status: Every Day    Packs/day: 1.00    Years: 10.00    Pack years: 10.00    Types: Cigarettes   Smokeless tobacco: Never  Substance and Sexual Activity   Alcohol use: Not on file   Drug use: Not on file   Sexual activity: Not on file  Other Topics Concern   Not on file  Social History Narrative   Not on file   Social Determinants of Health   Financial Resource Strain: Not on file  Food Insecurity: Not on file  Transportation Needs: Not on file  Physical Activity: Not on file  Stress: Not on file  Social Connections: Not on file   Additional Social History:  Divorced, has 1 son, employed, from [place], but lives in [place], [place].  Sleep: Fair  Appetite:  Fair  Current Medications: Current Facility-Administered Medications  Medication Dose Route Frequency Provider Last Rate Last Admin   acetaminophen (TYLENOL) tablet 650 mg  650 mg Oral Q6H PRN Mason Jim, Amy E, MD   650 mg at 06/06/21 2014   alum & mag hydroxide-simeth (MAALOX/MYLANTA) 200-200-20 MG/5ML suspension 30 mL  30 mL Oral Q4H PRN Jaclyn Shaggy, PA-C   30 mL at 06/07/21 2117   docusate sodium (COLACE) capsule 100 mg  100 mg Oral Daily PRN Massengill, Harrold Donath, MD   100 mg at 06/07/21 2117   enoxaparin (LOVENOX) injection 75 mg  75 mg Subcutaneous Q12H Mason Jim, Amy E, MD   75 mg at 06/08/21 2585   hydrOXYzine (ATARAX/VISTARIL) tablet 25 mg  25 mg Oral Q6H PRN Comer Locket, MD   25 mg at 06/07/21 2104   magnesium hydroxide (MILK OF MAGNESIA) suspension 30 mL  30 mL Oral Daily PRN Melbourne Abts W, PA-C       multivitamin with minerals tablet 1 tablet  1 tablet Oral Daily Bartholomew Crews E, MD   1 tablet at 06/03/21 2778   nicotine (NICODERM CQ - dosed in mg/24 hours) patch 14 mg  14 mg Transdermal Daily Nira Conn A, NP       OLANZapine (ZYPREXA) tablet 10 mg  10 mg Oral QHS Laveda Abbe, NP   10 mg at 06/07/21 2104   OLANZapine (ZYPREXA) tablet 2.5 mg  2.5 mg Oral  Daily Massengill, Nathan, MD   2.5 mg at 06/08/21 0714   ondansetron (ZOFRAN-ODT) disintegrating tablet 4 mg  4 mg Oral Q6H PRN Comer Locket, MD       sertraline (ZOLOFT) tablet 50 mg  50 mg Oral Daily Laveda Abbe, NP   50 mg at 06/08/21 2423   thiamine tablet 100 mg  100 mg Oral Daily Ophelia Shoulder E, NP   100 mg at 06/03/21 5361   traZODone (DESYREL) tablet 150 mg  150 mg Oral QHS,MR X 1 Massengill, Harrold Donath, MD   150 mg at 06/07/21 2235   Warfarin - Pharmacist Dosing Inpatient   Does not apply [id number] Comer Locket, MD   7.5 each at 06/07/21 1614   Lab Results:  Results for orders  placed or performed during the hospital encounter of 06/02/21 (from the past 48 hour(s))  Renal function panel     Status: Abnormal   Collection Time: 06/07/21  6:42 AM  Result Value Ref Range   Sodium 139 135 - 145 mmol/L   Potassium 4.0 3.5 - 5.1 mmol/L   Chloride 106 98 - 111 mmol/L   CO2 25 22 - 32 mmol/L   Glucose, Bld 103 (H) 70 - 99 mg/dL    Comment: Glucose reference range applies only to samples taken after fasting for at least 8 hours.   BUN 19 8 - 23 mg/dL   Creatinine, Ser 4.19 0.61 - 1.24 mg/dL   Calcium 9.3 8.9 - 62.2 mg/dL   Phosphorus 3.2 2.5 - 4.6 mg/dL   Albumin 4.2 3.5 - 5.0 g/dL   GFR, Estimated >29 >79 mL/min    Comment: (NOTE) Calculated using the CKD-EPI Creatinine Equation (2021)    Anion gap 8 5 - 15    Comment: Performed at [facility], [address], [place], [place] [address]   Blood Alcohol level:  Lab Results  Component Value Date   ETH <10 05/31/2021   Metabolic Disorder Labs: Lab Results  Component Value Date   HGBA1C 4.8 06/04/2021   MPG 91.06 06/04/2021   No results found for: PROLACTIN Lab Results  Component Value Date   CHOL 273 (H) 06/04/2021   TRIG 116 06/04/2021   HDL 38 (L) 06/04/2021   CHOLHDL 7.2 06/04/2021   VLDL 23 06/04/2021   LDLCALC 212 (H) 06/04/2021   Physical Findings: AIMS:  , ,  ,  ,    CIWA:   CIWA-Ar Total: 0 COWS:     Musculoskeletal: Strength & Muscle Tone: within normal limits Gait & Station: normal Patient leans: N/A  Psychiatric Specialty Exam:  Presentation  General Appearance: Disheveled  Eye Contact:Fair  Speech:Clear and Coherent; Slow  Speech Volume:Decreased  Handedness:Right  Mood and Affect  Mood:Anxious; Depressed (Rates depression #7, anxiety #9.)  Affect:Flat  Thought Process  Thought Processes:Coherent; Linear; Goal Directed  Descriptions of Associations:Intact  Orientation:Full (Time, Place and Person)  Thought Content:Rumination  History of Schizophrenia/Schizoaffective disorder:No data recorded Duration of Psychotic Symptoms:No data recorded Hallucinations:Hallucinations: Auditory Description of Auditory Hallucinations: "The voices are telling me to hurt myself".  Ideas of Reference:None  Suicidal Thoughts: reports passive suicidal thoughts. Denies intent or plan.   Homicidal Thoughts: denies  Sensorium  Memory:Immediate Good; Recent Good; Remote Good  Judgment:Fair  Insight:Fair  Executive Functions  Concentration:Good  Attention Span:Good  Recall:Good  Fund of Knowledge:Good  Language:Good  Psychomotor Activity  Psychomotor Activity:Psychomotor Activity: Normal  Assets  Assets:Communication Skills; Desire for Improvement; Housing; Resilience; Physical Health; Social Support; Vocational/Educational  Sleep  Sleep:Sleep: Good Number of Hours of Sleep: 6.75  Physical Exam: Physical Exam Vitals and nursing note reviewed.  Constitutional:      General: He is not in acute distress.    Appearance: He is normal weight. He is not ill-appearing or toxic-appearing.  HENT:     Head: Normocephalic.  Pulmonary:     Effort: Pulmonary effort is normal.  Musculoskeletal:        General: Normal range of motion.     Cervical back: Normal range of motion.  Neurological:     General: No focal deficit present.      Mental Status: He is alert.     Motor: No weakness.   Review of Systems  Constitutional: Negative.  Negative for fever.  HENT: Negative.  Negative for congestion.   Respiratory: Negative.  Negative for cough and shortness of breath.   Cardiovascular: Negative.  Negative for chest pain.  Gastrointestinal: Negative.  Negative for diarrhea, nausea and vomiting.  Genitourinary: Negative.   Musculoskeletal: Negative.   Neurological:  Positive for headaches.  Psychiatric/Behavioral:  Positive for depression, hallucinations and suicidal ideas. The patient is nervous/anxious.   All other systems reviewed and are negative.  Blood pressure 119/75, pulse 66, temperature 97.8 F (36.6 C), temperature source Oral, resp. rate 16, height 6' (1.829 m), weight 70.3 kg, SpO2 99 %. Body mass index is 21.02 kg/m.  Treatment Plan Summary: Daily contact with patient to assess and evaluate symptoms and progress in treatment and Medication management.   Continue inpatient hospitalization.  Will continue today 06/08/2021 plan as below except where it is noted.   Labs reviewed 10/15:  Lipid profile with cholesterol 273 and LDL 212. A1c 4.8   EKG results 10/15:  Vent. rate 62 BPM PR interval 168 ms QRS duration 102 ms QT/QTcB 438/444 ms P-R-T axes 73 77 98 Normal sinus rhythm Cannot rule out Inferior infarct , age undetermined Anterior infarct , age undetermined T wave abnormality, consider lateral ischemia Abnormal ECG  Compared with EKG from [facility] on 05/26/2021: Diagnosis Class Abnormal  Acquisition Device D3K  Ventricular Rate 71  Atrial Rate 71  P-R Interval 168  QRS Duration 102  Q-T Interval 394  QTC Calculation(Bazett) 428  Calculated P Axis 64  Calculated R Axis 75  Calculated T Axis 54   Diagnosis Normal sinus rhythm  Cannot rule out Inferior infarct (cited on or before 07-Jan-2018)  Anteroseptal infarct (cited on or before 12-Nov-2014)  Abnormal ECG  When compared with  ECG of 18-Apr-2021 11:04,  No significant change was found   Alcohol/benzodiazepine withdrawal symptoms.  CIWA Ativan taper completed.   Anticoagulation therapy:  Continue Lovenox to Coumadin bridge dosing as recommended until INR is above 2. *PT-INR not drawn this this morning, phlebotomist had trouble getting blood. Blood drawn to be repeated this evening.   Bipolar disorder - current episode depressed  Continue Zyprexa 2.5 mg once daily in the morning for racing thoughts & anxiety during the day  Continue Olanzapine 10 mg PO QHS Continue Sertraline 50 mg once daily  Continue Vistaril 25 mg PO every 6 hrs prn. Continue Trazodone 150 mg po Q bedtime.  Other prn medication:  Continue colace for stool softener  Acetaminophen 650 mg PO every 6 hrs prn for pain/fever. Zofran-ODT 4 mg PO every 6 hrs prn for N/V. Thiamine 100 mg PO daily for thiamine replacement. Multivitamin 1 tab PO daily for nutritional supplementation.   Nicotine withdrawal symptoms. Continue nicotine patch 14 mg  trans-dermally every 24 hours.   Continue every 15 minute safety checks Encourage participation in the therapeutic milieu Discharge planning in progress - Patient will need PCP appointment for follow up for hyperlipidemia and cardiology appointment or referral for cardiomyopathy and anticoagulant therapy   Phillip Stammer, NP, pmhnp, fnp-bc 06/08/2021, 11:06 AM  Patient ID: Phillip Marquez, male   DOB: 03/14/1961, 60 y.o.   MRN: [id number]

## 2021-06-08 NOTE — BH IP Treatment Plan (Signed)
Interdisciplinary Treatment and Diagnostic Plan Update  06/08/2021 Time of Session: 9:35am  Phillip Marquez MRN: [id number]  Principal Diagnosis: Bipolar depression (HCC)  Secondary Diagnoses: Principal Problem:   Bipolar depression (HCC) Active Problems:   Alcohol use disorder, severe, dependence (HCC)   Severe benzodiazepine use disorder (HCC)   Current Medications:  Current Facility-Administered Medications  Medication Dose Route Frequency Provider Last Rate Last Admin   acetaminophen (TYLENOL) tablet 650 mg  650 mg Oral Q6H PRN Bartholomew Crews E, MD   650 mg at 06/08/21 1402   alum & mag hydroxide-simeth (MAALOX/MYLANTA) 200-200-20 MG/5ML suspension 30 mL  30 mL Oral Q4H PRN Melbourne Abts W, PA-C   30 mL at 06/07/21 2117   docusate sodium (COLACE) capsule 100 mg  100 mg Oral Daily PRN Massengill, Harrold Donath, MD   100 mg at 06/07/21 2117   enoxaparin (LOVENOX) injection 75 mg  75 mg Subcutaneous Q12H Singleton, Amy E, MD       hydrOXYzine (ATARAX/VISTARIL) tablet 25 mg  25 mg Oral Q6H PRN Mason Jim, Amy E, MD   25 mg at 06/08/21 1314   magnesium hydroxide (MILK OF MAGNESIA) suspension 30 mL  30 mL Oral Daily PRN Melbourne Abts W, PA-C       multivitamin with minerals tablet 1 tablet  1 tablet Oral Daily Mason Jim, Amy E, MD   1 tablet at 06/03/21 4401   nicotine (NICODERM CQ - dosed in mg/24 hours) patch 14 mg  14 mg Transdermal Daily Nira Conn A, NP       OLANZapine (ZYPREXA) tablet 10 mg  10 mg Oral QHS Laveda Abbe, NP   10 mg at 06/07/21 2104   OLANZapine (ZYPREXA) tablet 2.5 mg  2.5 mg Oral Daily Massengill, Nathan, MD   2.5 mg at 06/08/21 0714   ondansetron (ZOFRAN-ODT) disintegrating tablet 4 mg  4 mg Oral Q6H PRN Comer Locket, MD       sertraline (ZOLOFT) tablet 50 mg  50 mg Oral Daily Laveda Abbe, NP   50 mg at 06/08/21 0272   thiamine tablet 100 mg  100 mg Oral Daily Ophelia Shoulder E, NP   100 mg at 06/03/21 5366   traZODone (DESYREL) tablet 150 mg  150 mg  Oral QHS,MR X 1 Massengill, Nathan, MD   150 mg at 06/07/21 2235   warfarin (COUMADIN) tablet 5 mg  5 mg Oral ONCE-1600 Comer Locket, MD       Warfarin - Pharmacist Dosing Inpatient   Does not apply [id number] Comer Locket, MD   7.5 each at 06/07/21 1614   PTA Medications: Medications Prior to Admission  Medication Sig Dispense Refill Last Dose   acetaminophen (TYLENOL) 500 MG tablet Take 1,000 mg by mouth every 6 (six) hours as needed for mild pain.       Patient Stressors:    Patient Strengths:    Treatment Modalities: Medication Management, Group therapy, Case management,  1 to 1 session with clinician, Psychoeducation, Recreational therapy.   Physician Treatment Plan for Primary Diagnosis: Bipolar depression (HCC) Long Term Goal(s): Improvement in symptoms so as ready for discharge   Short Term Goals: Ability to identify changes in lifestyle to reduce recurrence of condition will improve Ability to verbalize feelings will improve Ability to disclose and discuss suicidal ideas Ability to demonstrate self-control will improve Ability to identify and develop effective coping behaviors will improve Ability to maintain clinical measurements within normal limits will improve Compliance with prescribed medications will improve Ability  to identify triggers associated with substance abuse/mental health issues will improve  Medication Management: Evaluate patient's response, side effects, and tolerance of medication regimen.  Therapeutic Interventions: 1 to 1 sessions, Unit Group sessions and Medication administration.  Evaluation of Outcomes: Progressing  Physician Treatment Plan for Secondary Diagnosis: Principal Problem:   Bipolar depression (HCC) Active Problems:   Alcohol use disorder, severe, dependence (HCC)   Severe benzodiazepine use disorder (HCC)  Long Term Goal(s): Improvement in symptoms so as ready for discharge   Short Term Goals: Ability to identify changes in  lifestyle to reduce recurrence of condition will improve Ability to verbalize feelings will improve Ability to disclose and discuss suicidal ideas Ability to demonstrate self-control will improve Ability to identify and develop effective coping behaviors will improve Ability to maintain clinical measurements within normal limits will improve Compliance with prescribed medications will improve Ability to identify triggers associated with substance abuse/mental health issues will improve     Medication Management: Evaluate patient's response, side effects, and tolerance of medication regimen.  Therapeutic Interventions: 1 to 1 sessions, Unit Group sessions and Medication administration.  Evaluation of Outcomes: Progressing   RN Treatment Plan for Primary Diagnosis: Bipolar depression (HCC) Long Term Goal(s): Knowledge of disease and therapeutic regimen to maintain health will improve  Short Term Goals: Ability to remain free from injury will improve, Ability to participate in decision making will improve, Ability to verbalize feelings will improve, Ability to disclose and discuss suicidal ideas, and Ability to identify and develop effective coping behaviors will improve  Medication Management: RN will administer medications as ordered by provider, will assess and evaluate patient's response and provide education to patient for prescribed medication. RN will report any adverse and/or side effects to prescribing provider.  Therapeutic Interventions: 1 on 1 counseling sessions, Psychoeducation, Medication administration, Evaluate responses to treatment, Monitor vital signs and CBGs as ordered, Perform/monitor CIWA, COWS, AIMS and Fall Risk screenings as ordered, Perform wound care treatments as ordered.  Evaluation of Outcomes: Progressing   LCSW Treatment Plan for Primary Diagnosis: Bipolar depression (HCC) Long Term Goal(s): Safe transition to appropriate next level of care at discharge,  Engage patient in therapeutic group addressing interpersonal concerns.  Short Term Goals: Engage patient in aftercare planning with referrals and resources, Increase social support, Increase emotional regulation, Facilitate acceptance of mental health diagnosis and concerns, Identify triggers associated with mental health/substance abuse issues, and Increase skills for wellness and recovery  Therapeutic Interventions: Assess for all discharge needs, 1 to 1 time with Social worker, Explore available resources and support systems, Assess for adequacy in community support network, Educate family and significant other(s) on suicide prevention, Complete Psychosocial Assessment, Interpersonal group therapy.  Evaluation of Outcomes: Progressing   Progress in Treatment: Attending groups: Yes. Participating in groups: Yes. Taking medication as prescribed: Yes. Toleration medication: Yes. Family/Significant other contact made: No, will contact:  Declined Consents  Patient understands diagnosis: Yes. Discussing patient identified problems/goals with staff: Yes. Medical problems stabilized or resolved: Yes. Denies suicidal/homicidal ideation: Yes. Issues/concerns per patient self-inventory: No.   New problem(s) identified: No, Describe:  None   New Short Term/Long Term Goal(s): medication stabilization, elimination of SI thoughts, development of comprehensive mental wellness plan.   Patient Goals: "To get off of alcohol and xanax"  Discharge Plan or Barriers: Patient recently admitted. CSW will continue to follow and assess for appropriate referrals and possible discharge planning.   Reason for Continuation of Hospitalization: Depression Medication stabilization Suicidal ideation Withdrawal symptoms  Estimated Length  of Stay: 3 to 5 days    Scribe for Treatment Team: Catha Brow 06/08/2021 2:43 PM

## 2021-06-08 NOTE — Group Note (Signed)
Recreation Therapy Group Note   Group Topic:Stress Management  Group Date: 06/08/2021 Start Time: 0930 End Time: 0945 Facilitators: Caroll Rancher, LRT/CTRS Location: [address] Dayroom  Goal Area(s) Addresses:  Patient will identify positive stress management techniques. Patient will identify benefits of using stress management post d/c.   Group Description:  Meditation.  LRT played a meditation that focused on setting personal boundaries for self care.  The meditation focused on learning to say no for things you don't want to do instead of trying to make others happy at the expense of yourself.   Affect/Mood: N/A   Participation Level: Did not attend    Clinical Observations/Individualized Feedback: Pt did not attend group.    Plan: Continue to engage patient in RT group sessions 2-3x/week.   Caroll Rancher, LRT/CTRS 06/08/2021 11:35 AM

## 2021-06-08 NOTE — Progress Notes (Addendum)
D:  Patient's self inventory sheet, patient   sleeps fair, sleep medication helpful.  Good appetite, low energy level, poor concentration.  Rated depression 7, denied hopeless, anxiety 9.  SI, contracts for safety, no plan.  Denied physical problems.  Denied physical pain.  Goal is get meds regulated to not feel so tired.  Plans to talk to MD.  No discharge plans. A:  Medications administered per MD orders.  Emotional support and encouragement given patient. R:  Denied HI, contracts for safety.  SI no plan, contracts for safety.  Denied visual hallucinations.  Does hear negative voices.   Safety maintained with 15 minute checks.

## 2021-06-08 NOTE — [facility] Group Notes (Signed)
[facility] Group Notes:  (Nursing/MHT/Case Management/Adjunct)  Date:  06/08/2021  Time:  8:27 PM  Type of Therapy:   NA Group  Participation Level:  Active  Participation Quality:  Appropriate  Affect:  Appropriate  Cognitive:  Appropriate  Insight:  Appropriate, Good, and Improving  Engagement in Group:  Developing/Improving  Modes of Intervention:  Discussion  Summary of Progress/Problems: Pt was quit but attentive and engaged in group.   Lorita Officer 06/08/2021, 8:27 PM

## 2021-06-09 DIAGNOSIS — F3160 Bipolar disorder, current episode mixed, unspecified: Secondary | ICD-10-CM | POA: Diagnosis not present

## 2021-06-09 MED ORDER — TRAZODONE HCL 100 MG PO TABS
200.0000 mg | ORAL_TABLET | Freq: Every day | ORAL | Status: DC
  Administered 2021-06-09 – 2021-06-13 (×5): 200 mg via ORAL
  Filled 2021-06-09 (×10): qty 2

## 2021-06-09 MED ORDER — OLANZAPINE 2.5 MG PO TABS
2.5000 mg | ORAL_TABLET | Freq: Once | ORAL | Status: AC
  Administered 2021-06-09: 2.5 mg via ORAL
  Filled 2021-06-09: qty 1

## 2021-06-09 MED ORDER — OLANZAPINE 5 MG PO TABS
5.0000 mg | ORAL_TABLET | Freq: Every day | ORAL | Status: DC
  Administered 2021-06-10 – 2021-06-14 (×5): 5 mg via ORAL
  Filled 2021-06-09 (×2): qty 1
  Filled 2021-06-09: qty 2
  Filled 2021-06-09: qty 1
  Filled 2021-06-09: qty 28
  Filled 2021-06-09: qty 1
  Filled 2021-06-09: qty 28
  Filled 2021-06-09: qty 1

## 2021-06-09 NOTE — Progress Notes (Signed)
   06/08/21 2108  Psych Admission Type (Psych Patients Only)  Admission Status Voluntary  Psychosocial Assessment  Patient Complaints Anxiety;Depression  Eye Contact Fair  Facial Expression Anxious  Affect Appropriate to circumstance  Speech Soft;Logical/coherent  Interaction Forwards little;Needy  Motor Activity Slow  Appearance/Hygiene Unremarkable;Improved  Behavior Characteristics Cooperative;Appropriate to situation  Mood Depressed;Anxious  Thought Process  Coherency WDL  Content WDL  Delusions None reported or observed  Perception Hallucinations  Hallucination Auditory  Judgment Poor  Confusion None  Danger to Self  Current suicidal ideation? Passive  Self-Injurious Behavior No self-injurious ideation or behavior indicators observed or expressed   Agreement Not to Harm Self Yes  Description of Agreement Verbal contract  Danger to Others  Danger to Others None reported or observed

## 2021-06-09 NOTE — Progress Notes (Addendum)
[facility] MD Progress Note  06/09/2021 5:50 PM Oneil Behney  MRN:  [id number]  Subjective: Paymon reports, "I'm feeling a little better today. I'm still having a lot of anxiety, my mood is still swinging some. It is because of the bad anxiety that I take too much valium".  Daily notes: Patient is seen. Chart reviewed. The chart findings discussed with the treatment team.   He presents alert, oriented & aware of situation. He is out of bed today, visible on the unit, attending group sessions. He dressed casually today in jeans & a t-shirt. He says he is doing a little better. However, continues to endorse anxiety symptoms. He says it is because of his bad anxiety that made him take a lot of Valium prior to admission. Tiquan says he does not think that he is on the right dose of medications for his symptoms. His am Zyprexa is increased from 2.5 mg to 5 mg daily  & Trazodone increased from 150 mg to 200 mg starting tonight. He currently denies any SIHI, AVH, delusional thoughts or paranoia. He does not appear to be responding to any internal stimuli. He is taking & tolerating his treatment regimen. Denies any side effects. Patient is encouraged to continue to attend group sessions. His PT-INR is in, reviewed. PT 16.6 & INR 1.4. Will continue patient's plan of care as already in progress. Vital signs: stable.  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 He has been a regular patient in another psychiatric hospital x multiple times all related to substance use & bipolar disorder symptoms.  Giuliano is admitted to [facility] from the [facility] hospital with complaints of worsening alcohol use, benzodiazepine use & suicidal ideations with a plan to drive his car into a bridge. His UDS was positive for benzodiazepine & has a BAL of <10.   See MAR for Lovenox to Coumadin bridge  dosing.   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 (See 04/06/2021 [facility] in chart review). He was recommended for anticoagulant therapy and was bridged from Lovenox to Coumadin, however he did not follow up after he was discharged and he continues to drink heavily. Medicine was consulted (see note in chart for recommendations), and  discussed his options with him and the risks and benefits of anticoagulation versus doing nothing he stated he will think about what he wants to do. He stated he did not take it seriously in August.   Principal Problem: Bipolar depression (HCC)  Diagnosis: Principal Problem:   Bipolar depression (HCC) Active Problems:   Alcohol use disorder, severe, dependence (HCC)   Severe benzodiazepine use disorder (HCC)  Total Time spent with patient: 15 minutes  Past Psychiatric History:  Yes, Bipolar disorder, alcohol use disorder, cocaine use disorder.  Past Medical History: History reviewed. No pertinent past medical history. History reviewed. No pertinent surgical history.  Family History: History reviewed. No pertinent family history.  Family Psychiatric  History: Bipolar disorder: Sister  Social History:  Social History   Substance and Sexual Activity  Alcohol Use None     Social History   Substance and Sexual Activity  Drug Use Not on file    Social History   Socioeconomic History   Marital status: Divorced    Spouse name: Not on file   Number of  children: Not on file   Years of education: Not on file   Highest education level: Not on file  Occupational History   Not on file  Tobacco Use   Smoking status: Every Day    Packs/day: 1.00    Years: 10.00    Pack years: 10.00    Types: Cigarettes   Smokeless tobacco: Never  Substance and Sexual Activity   Alcohol use: Not on file   Drug use: Not on file   Sexual activity: Not on file  Other Topics Concern   Not  on file  Social History Narrative   Not on file   Social Determinants of Health   Financial Resource Strain: Not on file  Food Insecurity: Not on file  Transportation Needs: Not on file  Physical Activity: Not on file  Stress: Not on file  Social Connections: Not on file   Additional Social History:  Divorced, has 1 son, employed, from [place], but lives in [place], [place].  Sleep: Fair  Appetite:  Fair  Current Medications: Current Facility-Administered Medications  Medication Dose Route Frequency Provider Last Rate Last Admin   acetaminophen (TYLENOL) tablet 650 mg  650 mg Oral Q6H PRN Comer Locket, MD   650 mg at 06/09/21 1403   alum & mag hydroxide-simeth (MAALOX/MYLANTA) 200-200-20 MG/5ML suspension 30 mL  30 mL Oral Q4H PRN Jaclyn Shaggy, PA-C   30 mL at 06/08/21 1928   docusate sodium (COLACE) capsule 100 mg  100 mg Oral Daily PRN Phineas Inches, MD   100 mg at 06/07/21 2117   enoxaparin (LOVENOX) injection 75 mg  75 mg Subcutaneous Q12H Comer Locket, MD   75 mg at 06/09/21 8938   hydrOXYzine (ATARAX/VISTARIL) tablet 25 mg  25 mg Oral TID PRN Jackelyn Poling, NP   25 mg at 06/09/21 1403   magnesium hydroxide (MILK OF MAGNESIA) suspension 30 mL  30 mL Oral Daily PRN Melbourne Abts W, PA-C       multivitamin with minerals tablet 1 tablet  1 tablet Oral Daily Mason Jim, Amy E, MD   1 tablet at 06/09/21 0750   nicotine (NICODERM CQ - dosed in mg/24 hours) patch 14 mg  14 mg Transdermal Daily Nira Conn A, NP       OLANZapine (ZYPREXA) tablet 10 mg  10 mg Oral QHS Laveda Abbe, NP   10 mg at 06/08/21 2108   OLANZapine (ZYPREXA) tablet 2.5 mg  2.5 mg Oral Once Phineas Inches, MD       Melene Muller ON 06/10/2021] OLANZapine (ZYPREXA) tablet 5 mg  5 mg Oral Daily Nwoko, Agnes I, NP       sertraline (ZOLOFT) tablet 50 mg  50 mg Oral Daily Laveda Abbe, NP   50 mg at 06/09/21 0750   thiamine tablet 100 mg  100 mg Oral Daily Ophelia Shoulder E, NP   100 mg  at 06/03/21 1017   traZODone (DESYREL) tablet 200 mg  200 mg Oral QHS Sanjuana Kava, NP       Warfarin - Pharmacist Dosing Inpatient   Does not apply P1025 Comer Locket, MD   7.5 each at 06/07/21 1614   Lab Results:  Results for orders placed or performed during the hospital encounter of 06/02/21 (from the past 48 hour(s))  Protime-INR     Status: Abnormal   Collection Time: 06/08/21  6:26 PM  Result Value Ref Range   Prothrombin Time 16.6 (H) 11.4 - 15.2 seconds  INR 1.4 (H) 0.8 - 1.2    Comment: (NOTE) INR goal varies based on device and disease states. Performed at [facility], [address], [place], [place] [address]    Blood Alcohol level:  Lab Results  Component Value Date   ETH <10 05/31/2021   Metabolic Disorder Labs: Lab Results  Component Value Date   HGBA1C 4.8 06/04/2021   MPG 91.06 06/04/2021   No results found for: PROLACTIN Lab Results  Component Value Date   CHOL 273 (H) 06/04/2021   TRIG 116 06/04/2021   HDL 38 (L) 06/04/2021   CHOLHDL 7.2 06/04/2021   VLDL 23 06/04/2021   LDLCALC 212 (H) 06/04/2021   Physical Findings: AIMS:  , ,  ,  ,    CIWA:  CIWA-Ar Total: 1 COWS:     Musculoskeletal: Strength & Muscle Tone: within normal limits Gait & Station: normal Patient leans: N/A  Psychiatric Specialty Exam:  Presentation  General Appearance: Casual; Fairly Groomed  Eye Contact:Good  Speech:Clear and Coherent; Slow  Speech Volume:Normal  Handedness:Right  Mood and Affect  Mood:Anxious; Depressed ("But, I'm feeling a little better today".)  Affect:Appropriate  Thought Process  Thought Processes:Coherent; Linear; Goal Directed  Descriptions of Associations:Intact  Orientation:Full (Time, Place and Person)  Thought Content:Rumination  History of Schizophrenia/Schizoaffective disorder: No Duration of Psychotic Symptoms: NA Hallucinations:Hallucinations: None  Ideas of Reference:None  Suicidal  Thoughts: Currently denies any suicidal ideations, plans or intent.  Homicidal Thoughts: Denies  Sensorium  Memory:Immediate Good; Recent Good; Remote Good  Judgment:Fair  Insight:Fair  Executive Functions  Concentration:Good  Attention Span:Good  Recall:Good  Fund of Knowledge:Good  Language:Good  Psychomotor Activity  Psychomotor Activity:Psychomotor Activity: Normal  Assets  Assets:Communication Skills; Desire for Improvement; Housing; Resilience; Physical Health; Social Support; Vocational/Educational  Sleep  Sleep:Sleep: Good Number of Hours of Sleep: 6.75  Physical Exam: Physical Exam Vitals and nursing note reviewed.  Constitutional:      General: He is not in acute distress.    Appearance: He is normal weight. He is not ill-appearing or toxic-appearing.  HENT:     Head: Normocephalic.  Pulmonary:     Effort: Pulmonary effort is normal.  Musculoskeletal:        General: Normal range of motion.     Cervical back: Normal range of motion.  Neurological:     General: No focal deficit present.     Mental Status: He is alert.     Motor: No weakness.   Review of Systems  Constitutional: Negative.  Negative for fever.  HENT: Negative.  Negative for congestion.   Respiratory: Negative.  Negative for cough and shortness of breath.   Cardiovascular: Negative.  Negative for chest pain.  Gastrointestinal: Negative.  Negative for diarrhea, nausea and vomiting.  Genitourinary: Negative.   Musculoskeletal: Negative.   Neurological:  Positive for headaches.  Psychiatric/Behavioral:  Positive for depression, hallucinations and suicidal ideas. The patient is nervous/anxious.   All other systems reviewed and are negative.  Blood pressure 109/63, pulse 73, temperature 97.8 F (36.6 C), resp. rate 16, height 6' (1.829 m), weight 70.3 kg, SpO2 98 %. Body mass index is 21.02 kg/m.  Treatment Plan Summary: Daily contact with patient to assess and evaluate symptoms and  progress in treatment and Medication management.   Continue inpatient hospitalization.  Will continue today 06/09/2021 plan as below except where it is noted.   Labs reviewed 10/15:  Lipid profile with cholesterol 273 and LDL 212. A1c 4.8   EKG  results 10/15:  Vent. rate 62 BPM PR interval 168 ms QRS duration 102 ms QT/QTcB 438/444 ms P-R-T axes 73 77 98 Normal sinus rhythm Cannot rule out Inferior infarct , age undetermined Anterior infarct , age undetermined T wave abnormality, consider lateral ischemia Abnormal ECG  Compared with EKG from [facility] on 05/26/2021: Diagnosis Class Abnormal  Acquisition Device D3K  Ventricular Rate 71  Atrial Rate 71  P-R Interval 168  QRS Duration 102  Q-T Interval 394  QTC Calculation(Bazett) 428  Calculated P Axis 64  Calculated R Axis 75  Calculated T Axis 54   Diagnosis Normal sinus rhythm  Cannot rule out Inferior infarct (cited on or before 07-Jan-2018)  Anteroseptal infarct (cited on or before 12-Nov-2014)  Abnormal ECG  When compared with ECG of 18-Apr-2021 11:04,  No significant change was found   Alcohol/benzodiazepine withdrawal symptoms.  CIWA Ativan taper completed.   Anticoagulation therapy:  Continue Lovenox to Coumadin bridge dosing as recommended until INR is above 2. *PT-INR results reviewed: PT 16.6, INR 1.4.   Bipolar disorder - current episode depressed  Increased Zyprexa from 2.5 mg to Zyprexa 5 mg po daily in the morning for racing thoughts & anxiety during the day. Administer Zyprexa 2.5 mg one time dose today to equal 5 mg with the already 2.5 mg received earlier this morning.  Continue Olanzapine 10 mg PO QHS Continue Sertraline 50 mg once daily  Continue Vistaril 25 mg PO every 6 hrs prn. Continue Trazodone 150 mg po Q bedtime.  Other prn medication:  Continue colace for stool softener  Acetaminophen 650 mg PO every 6 hrs prn for pain/fever. Zofran-ODT 4 mg PO every 6 hrs prn for N/V. Thiamine  100 mg PO daily for thiamine replacement. Multivitamin 1 tab PO daily for nutritional supplementation.   Nicotine withdrawal symptoms. Continue nicotine patch 14 mg  trans-dermally every 24 hours.   Continue every 15 minute safety checks Encourage participation in the therapeutic milieu Discharge planning in progress - Patient will need PCP appointment for follow up for hyperlipidemia and cardiology appointment or referral for cardiomyopathy and anticoagulant therapy   Armandina Stammer, NP, pmhnp, fnp-bc 06/09/2021, 5:50 PM  Total Time Spent in Direct Patient Care:  I personally spent 30 minutes on the unit in direct patient care. The direct patient care time included face-to-face time with the patient, reviewing the patient's chart, communicating with other professionals, and coordinating care. Greater than 50% of this time was spent in counseling or coordinating care with the patient regarding goals of hospitalization, psycho-education, and discharge planning needs.  I have independently evaluated the patient during a face-to-face assessment on 06/09/21. I reviewed the patient's chart, and I participated in key portions of the service. I discussed the case with the APP, and I agree with the assessment and plan of care as documented in the APP's note, , as addended by me or notated below:  Mood is some better. Main symptom is anxiety.  Will give one more dose of 2.5 mg zyprexa this afternoon, for total daily dose of zyprexa today equal to 17.5 mg. We discussed goal for treating anxiety and how we will prescribe addictive or controlled medications for treating anxiety, due to patient's h/o benzodiazepine use disorder and alcohol use disorder. Will consider pindolol for anxiety going forward.  Otherwise agree with note and plan.    Phineas Inches, MD Psychiatrist    Patient ID: [name], male   DOB: [birth year], 60 y.o.   MRN: [id number] Patient  ID: [name], male   DOB: 01/23/1961, 60  y.o.   MRN: [id number]

## 2021-06-09 NOTE — Progress Notes (Signed)
   06/09/21 2200  Psych Admission Type (Psych Patients Only)  Admission Status Voluntary  Psychosocial Assessment  Patient Complaints Depression;Anxiety  Eye Contact Fair  Facial Expression Anxious  Affect Appropriate to circumstance  Speech Logical/coherent;Soft  Interaction Forwards little;Needy  Motor Activity Slow  Appearance/Hygiene Unremarkable;Improved  Behavior Characteristics Cooperative;Appropriate to situation  Mood Depressed;Pleasant  Thought Process  Coherency WDL  Content WDL  Delusions None reported or observed  Perception Hallucinations  Hallucination Auditory  Judgment Poor  Confusion None  Danger to Self  Current suicidal ideation? Passive  Self-Injurious Behavior No self-injurious ideation or behavior indicators observed or expressed   Agreement Not to Harm Self Yes  Description of Agreement Verbal contract  Danger to Others  Danger to Others None reported or observed

## 2021-06-09 NOTE — [facility] Group Notes (Signed)
Adult Psychoeducational Group Note  Date:  06/09/2021 Time:  3:02 PM  Group Topic/Focus:  Wellness Toolbox:   The focus of this group is to discuss various aspects of wellness, balancing those aspects and exploring ways to increase the ability to experience wellness.  Patients will create a wellness toolbox for use upon discharge.  Participation Level:  Minimal  Participation Quality:  Inattentive  Affect:  Flat  Cognitive:  Lacking  Insight: Lacking  Engagement in Group:  Engaged  Modes of Intervention:  Activity  Additional Comments:  Patient attended but was reluctant to participate in the relaxation group activity.  Phillip Marquez 06/09/2021, 3:02 PM

## 2021-06-09 NOTE — Group Note (Signed)
Group Topic: Goal Setting  Group Date: 06/09/2021 Start Time: 0900 End Time: 0930 Facilitators: Letanya Froh, Deforest Hoyles, NT  [facility]  Number of Participants: 5  Group Focus: goals/reality orientation Treatment Modality:  Psychoeducation Interventions utilized were orientation Purpose: reinforce self-care  Name: Phillip Marquez Date of Birth: 1960-10-13  MR: [id number]    Level of Participation: Pt did not attend

## 2021-06-09 NOTE — Progress Notes (Signed)
Psychoeducational Group Note  Date:  06/09/2021 Time:  2015  Group Topic/Focus:  Wrap  up group  Participation Level: Did Not Attend  Participation Quality:  Not Applicable  Affect:  Not Applicable  Cognitive:  Not Applicable  Insight:  Not Applicable  Engagement in Group: Not Applicable  Additional Comments:  Did not attend.   Johann Capers S 06/09/2021, 9:25 PM

## 2021-06-09 NOTE — Progress Notes (Signed)
ANTICOAGULATION CONSULT NOTE - Follow Up Consult  Pharmacy Consult for :  coumadin/lovenox   Allergies  Allergen Reactions   Quetiapine Fumarate     Other reaction(s): Confusion   Ketorolac Tromethamine Rash   Lactose Intolerance (Gi) Diarrhea    Patient Measurements: Wt 76 kg   Vital Signs: Temp: 97.8 F (36.6 C) (10/20 0637) BP: 109/63 (10/20 1610) Pulse Rate: 73 (10/20 0638)  Labs: Recent Labs    06/07/21 0642 06/08/21 1826  LABPROT  --  16.6*  INR  --  1.4*  CREATININE 0.90  --     Estimated Creatinine Clearance: 85.7 mL/min (by C-G formula based on SCr of 0.9 mg/dL).   Medications:  Scheduled:   enoxaparin (LOVENOX) injection  75 mg Subcutaneous Q12H   multivitamin with minerals  1 tablet Oral Daily   nicotine  14 mg Transdermal Daily   OLANZapine  10 mg Oral QHS   OLANZapine  2.5 mg Oral Daily   sertraline  50 mg Oral Daily   thiamine  100 mg Oral Daily   traZODone  150 mg Oral QHS,MR X 1   Warfarin - Pharmacist Dosing Inpatient   Does not apply q1600    Assessment: INR increased to 1.4 today.  No problems noted with therapy.  Pt currently receiving Lovenox 75mg  sq q12.   Goal of Therapy:  INR 2-3    Plan:  Coumadin 5 mg x 1 today  PT/INR daily    06/09/2021,7:06 AM

## 2021-06-09 NOTE — [facility] Group Notes (Signed)
Pt attended crisis management group.  

## 2021-06-10 ENCOUNTER — Telehealth: Payer: Self-pay

## 2021-06-10 DIAGNOSIS — F3160 Bipolar disorder, current episode mixed, unspecified: Secondary | ICD-10-CM | POA: Diagnosis not present

## 2021-06-10 LAB — PROTIME-INR
INR: 1.3 — ABNORMAL HIGH (ref 0.8–1.2)
Prothrombin Time: 16.4 seconds — ABNORMAL HIGH (ref 11.4–15.2)

## 2021-06-10 MED ORDER — OLANZAPINE 7.5 MG PO TABS
15.0000 mg | ORAL_TABLET | Freq: Every day | ORAL | Status: DC
  Administered 2021-06-10 – 2021-06-13 (×4): 15 mg via ORAL
  Filled 2021-06-10 (×6): qty 2

## 2021-06-10 MED ORDER — SERTRALINE HCL 50 MG PO TABS
75.0000 mg | ORAL_TABLET | Freq: Every day | ORAL | Status: DC
  Administered 2021-06-11 – 2021-06-14 (×4): 75 mg via ORAL
  Filled 2021-06-10 (×6): qty 1

## 2021-06-10 MED ORDER — PINDOLOL 5 MG PO TABS
2.5000 mg | ORAL_TABLET | Freq: Two times a day (BID) | ORAL | Status: DC
  Administered 2021-06-10 – 2021-06-13 (×6): 2.5 mg via ORAL
  Filled 2021-06-10: qty 1
  Filled 2021-06-10: qty 7
  Filled 2021-06-10: qty 1
  Filled 2021-06-10: qty 7
  Filled 2021-06-10 (×7): qty 1

## 2021-06-10 MED ORDER — WARFARIN SODIUM 10 MG PO TABS
10.0000 mg | ORAL_TABLET | Freq: Once | ORAL | Status: AC
  Administered 2021-06-10: 10 mg via ORAL
  Filled 2021-06-10: qty 1

## 2021-06-10 NOTE — [facility] Group Notes (Signed)
Adult Psychoeducational Group Note  Date:  06/10/2021 Time:  7:10 PM  Group Topic/Focus:  Coping With Mental Health Crisis:   The purpose of this group is to help patients identify strategies for coping with mental health crisis.  Group discusses possible causes of crisis and ways to manage them effectively.  Participation Level:  Active  Participation Quality:  Appropriate  Affect:  Appropriate  Cognitive:  Appropriate  Insight: Appropriate  Engagement in Group:  Supportive  Modes of Intervention:  Discussion  Additional Comments:  Pt engaged in group   Phillip Marquez S Mikal Blasdell 06/10/2021, 7:10 PM

## 2021-06-10 NOTE — Progress Notes (Signed)
[facility] MD Progress Note  06/10/2021 5:39 PM Phillip Marquez  MRN:  [id number]  Subjective: Phillip Marquez reports, "I'm feeling okay. I'm still having a lot of anxiety. I don't think that the dose of the Olanzapine that I'm on at night is enough, not helping. I used to take 300 mg of Trazodone, helps me to sleep better. But last night, I only took 200 mg of trazodone. That was the reason I did not sleep well last night. I'm still hearing voices telling me to kill myself".  Daily notes: Patient is seen. Chart reviewed. The chart findings discussed with the treatment team.   Phillip Marquez presents alert, oriented & aware of situation. He is visible on the unit, attending group sessions. He dressed casually today in jeans & a t-shirt. He says he is feeling okay. However, continues to endorse anxiety symptoms. Mac[place]ur says he does not think that he is on the right dose of medications for his symptoms. His am Zyprexa was increased to 5 mg  yesterday  & Trazodone increased from 150 mg to 200 mg last night. He says he did not sleep well last. Still endorsing auditory hallucinations telling to kill himself. He currently denies any SIHI, VH, delusional thoughts or paranoia. He does not appear to be responding to any internal stimuli. We have adjusted the doses his Zyprexa from 10 mg to 15 mg Q hs for bipolar disorder & Sertraline from 50 mg to 75 mg for depression. He is taking & tolerating his treatment regimen. Denies any side effects. Patient is encouraged to continue to attend group sessions. His PT-INR is in, reviewed. PT 16.4 & INR 1.3. Will continue patient's plan of care as already in progress. Vital signs: stable.  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 He has been a regular patient in another psychiatric hospital x multiple times all related to substance use & bipolar disorder  symptoms.  Phillip Marquez is admitted to [facility] from the [facility] hospital with complaints of worsening alcohol use, benzodiazepine use & suicidal ideations with a plan to drive his car into a bridge. His UDS was positive for benzodiazepine & has a BAL of <10.   See MAR for Lovenox to Coumadin bridge dosing.   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 (See 04/06/2021 [facility] in chart review). He was recommended for anticoagulant therapy and was bridged from Lovenox to Coumadin, however he did not follow up after he was discharged and he continues to drink heavily. Medicine was consulted (see note in chart for recommendations), and  discussed his options with him and the risks and benefits of anticoagulation versus doing nothing he stated he will think about what he wants to do. He stated he did not take it seriously in August.   Principal Problem: Bipolar depression (HCC)  Diagnosis: Principal Problem:   Bipolar depression (HCC) Active Problems:   Alcohol use disorder, severe, dependence (HCC)   Severe benzodiazepine use disorder (HCC)  Total Time spent with patient: 15 minutes  Past Psychiatric History:  Yes, Bipolar disorder, alcohol use disorder, cocaine use disorder.  Past Medical History: History reviewed. No pertinent past medical history. History reviewed. No pertinent surgical history.  Family History: History reviewed. No pertinent family history.  Family Psychiatric  History: Bipolar disorder: Sister  Social History:  Social  History   Substance and Sexual Activity  Alcohol Use None     Social History   Substance and Sexual Activity  Drug Use Not on file    Social History   Socioeconomic History   Marital status: Divorced    Spouse name: Not on file   Number of children: Not on file   Years of education: Not on file   Highest education level: Not on file  Occupational History   Not on file   Tobacco Use   Smoking status: Every Day    Packs/day: 1.00    Years: 10.00    Pack years: 10.00    Types: Cigarettes   Smokeless tobacco: Never  Substance and Sexual Activity   Alcohol use: Not on file   Drug use: Not on file   Sexual activity: Not on file  Other Topics Concern   Not on file  Social History Narrative   Not on file   Social Determinants of Health   Financial Resource Strain: Not on file  Food Insecurity: Not on file  Transportation Needs: Not on file  Physical Activity: Not on file  Stress: Not on file  Social Connections: Not on file   Additional Social History:  Divorced, has 1 son, employed, from [place], but lives in [place], [place].  Sleep: Fair  Appetite:  Fair  Current Medications: Current Facility-Administered Medications  Medication Dose Route Frequency Provider Last Rate Last Admin   acetaminophen (TYLENOL) tablet 650 mg  650 mg Oral Q6H PRN Comer Locket, MD   650 mg at 06/09/21 1403   alum & mag hydroxide-simeth (MAALOX/MYLANTA) 200-200-20 MG/5ML suspension 30 mL  30 mL Oral Q4H PRN Jaclyn Shaggy, PA-C   30 mL at 06/08/21 1928   docusate sodium (COLACE) capsule 100 mg  100 mg Oral Daily PRN Phineas Inches, MD   100 mg at 06/07/21 2117   enoxaparin (LOVENOX) injection 75 mg  75 mg Subcutaneous Q12H Comer Locket, MD   75 mg at 06/10/21 0744   hydrOXYzine (ATARAX/VISTARIL) tablet 25 mg  25 mg Oral TID PRN Jackelyn Poling, NP   25 mg at 06/10/21 0042   magnesium hydroxide (MILK OF MAGNESIA) suspension 30 mL  30 mL Oral Daily PRN Melbourne Abts W, PA-C       multivitamin with minerals tablet 1 tablet  1 tablet Oral Daily Comer Locket, MD   1 tablet at 06/09/21 0750   nicotine (NICODERM CQ - dosed in mg/24 hours) patch 14 mg  14 mg Transdermal Daily Nira Conn A, NP       OLANZapine (ZYPREXA) tablet 15 mg  15 mg Oral QHS Basia Mcginty I, NP       OLANZapine (ZYPREXA) tablet 5 mg  5 mg Oral Daily Kemari Narez I, NP   5 mg at  06/10/21 0747   pindolol (VISKEN) tablet 2.5 mg  2.5 mg Oral BID Massengill, Harrold Donath, MD       Melene Muller ON 06/11/2021] sertraline (ZOLOFT) tablet 75 mg  75 mg Oral Daily Ozella Comins I, NP       thiamine tablet 100 mg  100 mg Oral Daily Ophelia Shoulder E, NP   100 mg at 06/03/21 6712   traZODone (DESYREL) tablet 200 mg  200 mg Oral QHS Phillip Marquez I, NP   200 mg at 06/09/21 2159   Warfarin - Pharmacist Dosing Inpatient   Does not apply [id number] Comer Locket, MD   1 each at 06/10/21  1651   Lab Results:  Results for orders placed or performed during the hospital encounter of 06/02/21 (from the past 48 hour(s))  Protime-INR     Status: Abnormal   Collection Time: 06/08/21  6:26 PM  Result Value Ref Range   Prothrombin Time 16.6 (H) 11.4 - 15.2 seconds   INR 1.4 (H) 0.8 - 1.2    Comment: (NOTE) INR goal varies based on device and disease states. Performed at [facility], [address] [place], [place] [address]   Protime-INR     Status: Abnormal   Collection Time: 06/10/21  7:29 AM  Result Value Ref Range   Prothrombin Time 16.4 (H) 11.4 - 15.2 seconds   INR 1.3 (H) 0.8 - 1.2    Comment: (NOTE) INR goal varies based on device and disease states. Performed at [facility], [address], [place], [place] [address]    Blood Alcohol level:  Lab Results  Component Value Date   ETH <10 05/31/2021   Metabolic Disorder Labs: Lab Results  Component Value Date   HGBA1C 4.8 06/04/2021   MPG 91.06 06/04/2021   No results found for: PROLACTIN Lab Results  Component Value Date   CHOL 273 (H) 06/04/2021   TRIG 116 06/04/2021   HDL 38 (L) 06/04/2021   CHOLHDL 7.2 06/04/2021   VLDL 23 06/04/2021   LDLCALC 212 (H) 06/04/2021   Physical Findings: AIMS:  , ,  ,  ,    CIWA:  CIWA-Ar Total: 0 COWS:     Musculoskeletal: Strength & Muscle Tone: within normal limits Gait & Station: normal Patient leans: N/A  Psychiatric Specialty  Exam:  Presentation  General Appearance: Casual; Fairly Groomed  Eye Contact:Good  Speech:Clear and Coherent; Slow  Speech Volume:Normal  Handedness:Right  Mood and Affect  Mood:Anxious; Depressed ("But, I'm feeling a little better today".)  Affect:Appropriate  Thought Process  Thought Processes:Coherent; Linear; Goal Directed  Descriptions of Associations:Intact  Orientation:Full (Time, Place and Person)  Thought Content:Rumination  History of Schizophrenia/Schizoaffective disorder: No Duration of Psychotic Symptoms: NA Hallucinations: "Yes, auditory hallucinations telling me to kill myself"  Ideas of Reference:None  Suicidal Thoughts: Currently denies any suicidal ideations, plans or intent.  Homicidal Thoughts: Denies  Sensorium  Memory:Immediate Good; Recent Good; Remote Good  Judgment:Fair  Insight:Fair  Executive Functions  Concentration:Good  Attention Span:Good  Recall:Good  Fund of Knowledge:Good  Language:Good  Psychomotor Activity  Psychomotor Activity:No data recorded  Assets  Assets:Communication Skills; Desire for Improvement; Housing; Resilience; Physical Health; Social Support; Vocational/Educational  Sleep  Sleep: 4.5  Physical Exam: Physical Exam Vitals and nursing note reviewed.  Constitutional:      General: He is not in acute distress.    Appearance: He is normal weight. He is not ill-appearing or toxic-appearing.  HENT:     Head: Normocephalic.  Pulmonary:     Effort: Pulmonary effort is normal.  Musculoskeletal:        General: Normal range of motion.     Cervical back: Normal range of motion.  Neurological:     General: No focal deficit present.     Mental Status: He is alert.     Motor: No weakness.   Review of Systems  Constitutional: Negative.  Negative for fever.  HENT: Negative.  Negative for congestion.   Respiratory: Negative.  Negative for cough and shortness of breath.   Cardiovascular: Negative.   Negative for chest pain.  Gastrointestinal: Negative.  Negative for diarrhea, nausea and vomiting.  Genitourinary: Negative.   Musculoskeletal: Negative.   Neurological:  Positive for headaches.  Psychiatric/Behavioral:  Positive for depression, hallucinations and suicidal ideas. The patient is nervous/anxious.   All other systems reviewed and are negative.  Blood pressure 136/74, pulse 73, temperature 98.3 F (36.8 C), temperature source Oral, resp. rate 20, height 6' (1.829 m), weight 70.3 kg, SpO2 100 %. Body mass index is 21.02 kg/m.  Treatment Plan Summary: Daily contact with patient to assess and evaluate symptoms and progress in treatment and Medication management.   Continue inpatient hospitalization.  Will continue today 06/10/2021 plan as below except where it is noted.   Labs reviewed 10/15:  Lipid profile with cholesterol 273 and LDL 212. A1c 4.8   EKG results 10/15:  Vent. rate 62 BPM PR interval 168 ms QRS duration 102 ms QT/QTcB 438/444 ms P-R-T axes 73 77 98 Normal sinus rhythm Cannot rule out Inferior infarct , age undetermined Anterior infarct , age undetermined T wave abnormality, consider lateral ischemia Abnormal ECG  Compared with EKG from [facility] on 05/26/2021: Diagnosis Class Abnormal  Acquisition Device D3K  Ventricular Rate 71  Atrial Rate 71  P-R Interval 168  QRS Duration 102  Q-T Interval 394  QTC Calculation(Bazett) 428  Calculated P Axis 64  Calculated R Axis 75  Calculated T Axis 54   Diagnosis Normal sinus rhythm  Cannot rule out Inferior infarct (cited on or before 07-Jan-2018)  Anteroseptal infarct (cited on or before 12-Nov-2014)  Abnormal ECG  When compared with ECG of 18-Apr-2021 11:04,  No significant change was found   Alcohol/benzodiazepine withdrawal symptoms.  CIWA Ativan taper completed.   Anticoagulation therapy:  Continue Lovenox to Coumadin bridge dosing as recommended until INR is above 2. *PT-INR results  reviewed: PT 16.4, INR 1.3.   Bipolar disorder - current episode depressed  Continue Zyprexa 5 mg po daily in the morning for racing thoughts & anxiety during the day. (Completed) Zyprexa 2.5 mg one time dose today to equal 5 mg with the already 2.5 mg received earlier this morning.  Increased Olanzapine to 15 mg PO QHS starting tonight 06-10-21. Increased Sertraline 75 mg once daily starting 06-11-21.  Continue Vistaril 25 mg PO every 6 hrs prn. Continue Trazodone 150 mg po Q bedtime.  Other prn medication:  Continue colace for stool softener  Acetaminophen 650 mg PO every 6 hrs prn for pain/fever. Zofran-ODT 4 mg PO every 6 hrs prn for N/V. Thiamine 100 mg PO daily for thiamine replacement. Multivitamin 1 tab PO daily for nutritional supplementation.   Nicotine withdrawal symptoms. Continue nicotine patch 14 mg  trans-dermally every 24 hours.   Continue every 15 minute safety checks Encourage participation in the therapeutic milieu Discharge planning in progress - Patient will need PCP appointment for follow up for hyperlipidemia and cardiology appointment or referral for cardiomyopathy and anticoagulant therapy   Phillip Stammer, NP, pmhnp, fnp-bc 06/10/2021, 5:39 PM  Patient ID: [name], male   DOB: 01/08/1961, 60 y.o.   MRN: [id number] Patient ID: Dontrell Stuck, male   DOB: 12-26-1960, 60 y.o.   MRN: [id number] Patient ID: Bailen Geffre, male   DOB: 1960/11/26, 60 y.o.   MRN: [id number]

## 2021-06-10 NOTE — Progress Notes (Signed)
   06/10/21 0745  Psych Admission Type (Psych Patients Only)  Admission Status Voluntary  Psychosocial Assessment  Patient Complaints Anxiety;Disorientation  Eye Contact Fair  Facial Expression Anxious  Affect Appropriate to circumstance  Speech Logical/coherent;Soft  Interaction Forwards little;Needy  Motor Activity Slow  Appearance/Hygiene Unremarkable;Improved  Behavior Characteristics Appropriate to situation;Cooperative;Anxious  Mood Depressed;Anxious  Thought Process  Coherency WDL  Content WDL  Delusions None reported or observed  Perception WDL  Hallucination None reported or observed  Judgment WDL  Confusion None  Danger to Self  Current suicidal ideation? Denies  Self-Injurious Behavior No self-injurious ideation or behavior indicators observed or expressed   Agreement Not to Harm Self Yes  Description of Agreement Verbal contract  Danger to Others  Danger to Others None reported or observed

## 2021-06-10 NOTE — Group Note (Signed)
Recreation Therapy Group Note   Group Topic:Stress Management  Group Date: 06/10/2021 Start Time: 0930 End Time: 0945 Facilitators: Caroll Rancher, LRT/CTRS Location: [address] Dayroom  Goal Area(s) Addresses:  Patient will actively participate in stress management techniques presented during session.  Patient will successfully identify benefit of practicing stress management post d/c.   Group Description: Meditation.  LRT played a meditation that focused on looking at each day as a new beginning to try new things, make a new friend, take time for yourself or make peace with any individual you may have an issue with.  Patients were to listen and follow along as meditation played to engage in activity.   Affect/Mood: N/A   Participation Level: Did not attend    Clinical Observations/Individualized Feedback: Pt did not attend group.    Plan: Continue to engage patient in RT group sessions 2-3x/week.   Caroll Rancher, LRT/CTRS 06/10/2021 11:47 AM

## 2021-06-10 NOTE — BHH Counselor (Signed)
CSW encouraged pt to call [facility] and [facility] to complete pre-screen. Pt did not get up from bed.  Fredirick Lathe, LCSWA Clinicial Social Worker [employer]

## 2021-06-10 NOTE — Progress Notes (Signed)
   06/10/21 2000  Psych Admission Type (Psych Patients Only)  Admission Status Voluntary  Psychosocial Assessment  Patient Complaints Anxiety;Self-harm thoughts  Eye Contact Fair  Facial Expression Anxious  Affect Appropriate to circumstance  Speech Logical/coherent;Soft  Interaction Forwards little;Needy  Motor Activity Slow  Appearance/Hygiene Unremarkable;Improved  Behavior Characteristics Cooperative;Appropriate to situation;Anxious  Mood Anxious;Depressed  Thought Process  Coherency WDL  Content WDL  Delusions None reported or observed  Perception Hallucinations  Hallucination Command;Auditory (voices still the same and still telling him to kill himself)  Judgment WDL  Confusion None  Danger to Self  Current suicidal ideation? Passive  Self-Injurious Behavior No self-injurious ideation or behavior indicators observed or expressed   Agreement Not to Harm Self Yes  Description of Agreement Verbal contract  Danger to Others  Danger to Others None reported or observed   Pt seen at nurse's station. Pt endorses passive SI but contracts for safety. Pt denies HI, VH and pain. Pt endorses command AH. Says the voices are still telling him to kill himself and they are the same intensity as when he was admitted. Pt c/o heartburn from spicy food at dinnertime. Pt says he feels better. Pt hygiene improved. Rates anxiety and depression both 7/10.

## 2021-06-10 NOTE — Telephone Encounter (Signed)
Copied from CRM [id number]. Topic: General - Other >> Jun 10, 2021 11:29 AM Marylen Ponto wrote: Reason for CRM: Nolon Lennert with [facility] requests call back from [place] at  [phone]

## 2021-06-10 NOTE — Group Note (Signed)
LCSW Group Therapy Note  Group Date: 06/10/2021 Start Time: 1300 End Time: 1330   Type of Therapy and Topic:  Group Therapy - Healthy vs Unhealthy Coping Skills  Participation Level:  Active   Description of Group The focus of this group was to determine what unhealthy coping techniques typically are used by group members and what healthy coping techniques would be helpful in coping with various problems. Patients were guided in becoming aware of the differences between healthy and unhealthy coping techniques. Patients were asked to identify 2-3 healthy coping skills they would like to learn to use more effectively.  Therapeutic Goals Patients learned that coping is what human beings do all day long to deal with various situations in their lives Patients defined and discussed healthy vs unhealthy coping techniques Patients identified their preferred coping techniques and identified whether these were healthy or unhealthy Patients determined 2-3 healthy coping skills they would like to become more familiar with and use more often. Patients provided support and ideas to each other   Summary of Patient Progress:   Due to the acuity and complex discharge plans, group was not held. Patient was provided therapeutic worksheets and asked to meet with CSW as needed.    Therapeutic Modalities Cognitive Behavioral Therapy Motivational Interviewing  Rya Rausch M Tassie Pollett, LCSWA 06/10/2021  1:16 PM   

## 2021-06-10 NOTE — BHH Group Notes (Signed)
Did not attend goals group 

## 2021-06-10 NOTE — Progress Notes (Signed)
ANTICOAGULATION CONSULT NOTE - Follow Up Consult  Pharmacy Consult for :  coumadin/lovenox   Allergies  Allergen Reactions   Quetiapine Fumarate     Other reaction(s): Confusion   Ketorolac Tromethamine Rash   Lactose Intolerance (Gi) Diarrhea    Patient Measurements: Wt 76 kg   Vital Signs: Temp: 98.3 F (36.8 C) (10/21 0633) Temp Source: Oral (10/21 3748) BP: 126/84 (10/21 1147) Pulse Rate: 81 (10/21 1147)  Labs: Recent Labs    06/08/21 1826 06/10/21 0729  LABPROT 16.6* 16.4*  INR 1.4* 1.3*     Estimated Creatinine Clearance: 85.7 mL/min (by C-G formula based on SCr of 0.9 mg/dL).   Medications:  Scheduled:   enoxaparin (LOVENOX) injection  75 mg Subcutaneous Q12H   multivitamin with minerals  1 tablet Oral Daily   nicotine  14 mg Transdermal Daily   OLANZapine  10 mg Oral QHS   OLANZapine  5 mg Oral Daily   sertraline  50 mg Oral Daily   thiamine  100 mg Oral Daily   traZODone  200 mg Oral QHS   Warfarin - Pharmacist Dosing Inpatient   Does not apply q1600    Assessment: INR decreased slightly to 1.3 today.   No problems noted with therapy.  Pt currently receiving Lovenox 75mg  sq q12.   Goal of Therapy:  INR 2-3    Plan:  Coumadin 10 mg x 1 today  PT/INR daily    R 06/10/2021,1:48 PM

## 2021-06-11 LAB — PROTIME-INR
INR: 1.6 — ABNORMAL HIGH (ref 0.8–1.2)
Prothrombin Time: 18.8 seconds — ABNORMAL HIGH (ref 11.4–15.2)

## 2021-06-11 MED ORDER — WARFARIN SODIUM 5 MG PO TABS
5.0000 mg | ORAL_TABLET | Freq: Once | ORAL | Status: AC
  Administered 2021-06-11: 5 mg via ORAL
  Filled 2021-06-11: qty 1

## 2021-06-11 NOTE — BHH Group Notes (Signed)
.  Psychoeducational Group Note  Date: 06/11/2021 Time: 0900-1000    Goal Setting   Purpose of Group: This group helps to provide patients with the steps of setting a goal that is specific, measurable, attainable, realistic and time specific. A discussion on how we keep ourselves stuck with negative self talk.    Participation Level:  Did not attend   Mylissa Lambe A 

## 2021-06-11 NOTE — Progress Notes (Signed)
   06/11/21 1300  Psych Admission Type (Psych Patients Only)  Admission Status Voluntary  Psychosocial Assessment  Eye Contact Fair  Facial Expression Anxious  Affect Appropriate to circumstance  Speech Logical/coherent;Soft  Interaction Forwards little;Needy  Motor Activity Slow  Appearance/Hygiene Unremarkable;Improved  Aggressive Behavior  Effect No apparent injury  Thought Process  Coherency WDL  Content WDL  Delusions None reported or observed  Perception Hallucinations  Hallucination Command;Auditory (voices still the same and still telling him to kill himself)  Judgment WDL  Confusion None  Danger to Self  Current suicidal ideation? Passive  Self-Injurious Behavior No self-injurious ideation or behavior indicators observed or expressed   Agreement Not to Harm Self Yes  Description of Agreement Verbal contract  Danger to Others  Danger to Others None reported or observed

## 2021-06-11 NOTE — BHH Group Notes (Signed)
.  Psychoeducational Group Note    Date:06/11/2021 Time: 1300-1400    Purpose of Group: . The group focus' on teaching patients on how to identify their needs and how Life Skills:  A group where two lists are made. What people need and what are things that we do that are healthy. The lists are developed by the patients and it is explained that we often do the actions that are not healthy to get our list of needs met.  to develop the coping skills needed to get their needs met  Participation Level:  Did not attend   Paulino Rily

## 2021-06-11 NOTE — Progress Notes (Signed)
[facility] MD Progress Note  06/11/2021 2:35 PM Phillip Marquez  MRN:  [id number]  Subjective: Phillip Marquez reports, "I tossed & turned all night long. My mood is okay, still hearing voices telling me to kill myself. I feel safe here". Daily notes: Patient is seen. Chart reviewed. The chart findings discussed with the treatment team.  Phillip Marquez presents alert, oriented & aware of situation. Phillip Marquez is visible on the unit, attending group sessions. Phillip Marquez dressed casually today in jeans & a t-shirt. Phillip Marquez says his mood is okay. However, continues to endorse anxiety symptoms.  Phillip Marquez says Phillip Marquez did not sleep well last night, just tossed & turned all night long. Still endorsing auditory hallucinations telling him to kill himself. Phillip Marquez currently denies any SIHI, VH, delusional thoughts or paranoia. Phillip Marquez does not appear to be responding to any internal stimuli. We have adjusted the doses of his Zyprexa to 15 mg Q hs for bipolar disorder & Sertraline to 75 mg for depression. Phillip Marquez is taking & tolerating his treatment regimen. Denies any side effects. Patient is encouraged to continue to attend group sessions. His PT-INR is in, reviewed. PT 18.8 & INR 1.6. Will continue patient's plan of care as already in progress. Vital signs: stable. Slept for 6.75 hrs per documentation.  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 Phillip Marquez has been a regular patient in another psychiatric hospital x multiple times all related to substance use & bipolar disorder symptoms.  Phillip Marquez is admitted to [facility] from the [facility] hospital with complaints of worsening alcohol use, benzodiazepine use & suicidal ideations with a plan to drive his car into a bridge. His UDS was positive for benzodiazepine & has a BAL of <10.   See MAR for Lovenox to Coumadin bridge dosing.   Per previous notes: Phillip Marquez has a cardiac history of cardiomyopathy and a left  ventricular thrombus 2.73 x 1.14 in size. (See 04/06/2021 [facility] in chart review). Phillip Marquez was recommended for anticoagulant therapy and was bridged from Lovenox to Coumadin, however Phillip Marquez did not follow up after Phillip Marquez was discharged and Phillip Marquez continues to drink heavily. Medicine was consulted (see note in chart for recommendations), and  discussed his options with him and the risks and benefits of anticoagulation versus doing nothing Phillip Marquez stated Phillip Marquez will think about what Phillip Marquez wants to do. Phillip Marquez stated Phillip Marquez did not take it seriously in August.   Principal Problem: Bipolar depression (HCC)  Diagnosis: Principal Problem:   Bipolar depression (HCC) Active Problems:   Alcohol use disorder, severe, dependence (HCC)   Severe benzodiazepine use disorder (HCC)  Total Time spent with patient: 15 minutes  Past Psychiatric History:  Yes, Bipolar disorder, alcohol use disorder, cocaine use disorder.  Past Medical History: History reviewed. No pertinent past medical history. History reviewed. No pertinent surgical history.  Family History: History reviewed. No pertinent family history.  Family Psychiatric  History: Bipolar disorder: Sister  Social History:  Social History   Substance and Sexual Activity  Alcohol Use None     Social History   Substance and Sexual Activity  Drug Use Not on file    Social History   Socioeconomic History   Marital status: Divorced    Spouse name: Not on file   Number of children: Not on file   Years of education: Not on file   Highest education level: Not on file  Occupational History  Not on file  Tobacco Use   Smoking status: Every Day    Packs/day: 1.00    Years: 10.00    Pack years: 10.00    Types: Cigarettes   Smokeless tobacco: Never  Substance and Sexual Activity   Alcohol use: Not on file   Drug use: Not on file   Sexual activity: Not on file  Other Topics Concern   Not on file  Social History Narrative   Not on file   Social Determinants of Health    Financial Resource Strain: Not on file  Food Insecurity: Not on file  Transportation Needs: Not on file  Physical Activity: Not on file  Stress: Not on file  Social Connections: Not on file   Additional Social History:  Divorced, has 1 son, employed, from [place], but lives in [place], [place].  Sleep: Fair  Appetite:  Fair  Current Medications: Current Facility-Administered Medications  Medication Dose Route Frequency Provider Last Rate Last Admin   acetaminophen (TYLENOL) tablet 650 mg  650 mg Oral Q6H PRN Comer Locket, MD   650 mg at 06/09/21 1403   alum & mag hydroxide-simeth (MAALOX/MYLANTA) 200-200-20 MG/5ML suspension 30 mL  30 mL Oral Q4H PRN Melbourne Abts W, PA-C   30 mL at 06/10/21 2006   docusate sodium (COLACE) capsule 100 mg  100 mg Oral Daily PRN Phineas Inches, MD   100 mg at 06/07/21 2117   enoxaparin (LOVENOX) injection 75 mg  75 mg Subcutaneous Q12H Comer Locket, MD   75 mg at 06/11/21 1696   hydrOXYzine (ATARAX/VISTARIL) tablet 25 mg  25 mg Oral TID PRN Jackelyn Poling, Phillip Marquez   25 mg at 06/11/21 0636   magnesium hydroxide (MILK OF MAGNESIA) suspension 30 mL  30 mL Oral Daily PRN Melbourne Abts W, PA-C       multivitamin with minerals tablet 1 tablet  1 tablet Oral Daily Mason Jim, Amy E, MD   1 tablet at 06/09/21 0750   nicotine (NICODERM CQ - dosed in mg/24 hours) patch 14 mg  14 mg Transdermal Daily Nira Conn A, Phillip Marquez       OLANZapine (ZYPREXA) tablet 15 mg  15 mg Oral QHS Berdina Cheever, Nicole Kindred I, Phillip Marquez   15 mg at 06/10/21 2117   OLANZapine (ZYPREXA) tablet 5 mg  5 mg Oral Daily Phillip Marquez I, Phillip Marquez   5 mg at 06/11/21 0818   pindolol (VISKEN) tablet 2.5 mg  2.5 mg Oral BID Massengill, Harrold Donath, MD   2.5 mg at 06/11/21 0817   sertraline (ZOLOFT) tablet 75 mg  75 mg Oral Daily Phillip Marquez I, Phillip Marquez   75 mg at 06/11/21 7893   thiamine tablet 100 mg  100 mg Oral Daily Ophelia Shoulder E, Phillip Marquez   100 mg at 06/03/21 8101   traZODone (DESYREL) tablet 200 mg  200 mg Oral QHS Phillip Marquez I, Phillip Marquez   200 mg at 06/10/21 2159   warfarin (COUMADIN) tablet 5 mg  5 mg Oral ONCE-1600 Lorenza Evangelist, [facility]       Warfarin - Pharmacist Dosing Inpatient   Does not apply B5102 Comer Locket, MD   1 each at 06/10/21 1651   Lab Results:  Results for orders placed or performed during the hospital encounter of 06/02/21 (from the past 48 hour(s))  Protime-INR     Status: Abnormal   Collection Time: 06/10/21  7:29 AM  Result Value Ref Range   Prothrombin Time 16.4 (H) 11.4 - 15.2 seconds  INR 1.3 (H) 0.8 - 1.2    Comment: (NOTE) INR goal varies based on device and disease states. Performed at [facility], [address], [place], [place] [address]   Protime-INR     Status: Abnormal   Collection Time: 06/11/21  6:58 AM  Result Value Ref Range   Prothrombin Time 18.8 (H) 11.4 - 15.2 seconds   INR 1.6 (H) 0.8 - 1.2    Comment: (NOTE) INR goal varies based on device and disease states. Performed at [facility], [address] [place], [place] [address]    Blood Alcohol level:  Lab Results  Component Value Date   ETH <10 05/31/2021   Metabolic Disorder Labs: Lab Results  Component Value Date   HGBA1C 4.8 06/04/2021   MPG 91.06 06/04/2021   No results found for: PROLACTIN Lab Results  Component Value Date   CHOL 273 (H) 06/04/2021   TRIG 116 06/04/2021   HDL 38 (L) 06/04/2021   CHOLHDL 7.2 06/04/2021   VLDL 23 06/04/2021   LDLCALC 212 (H) 06/04/2021   Physical Findings: AIMS:  , ,  ,  ,    CIWA:  CIWA-Ar Total: 0 COWS:     Musculoskeletal: Strength & Muscle Tone: within normal limits Gait & Station: normal Patient leans: N/A  Psychiatric Specialty Exam:  Presentation  General Appearance: Appropriate for Environment; Casual; Fairly Groomed  Eye Contact:Good  Speech:Clear and Coherent; Normal Rate  Speech Volume:Normal  Handedness:Right  Mood and Affect  Mood:Anxious; Depressed  Affect:Appropriate;  Non-Congruent  Thought Process  Thought Processes:Coherent; Linear  Descriptions of Associations:Intact  Orientation:Full (Time, Place and Person)  Thought Content:Logical  History of Schizophrenia/Schizoaffective disorder: No Duration of Psychotic Symptoms: NA Hallucinations: "Yes, auditory hallucinations telling me to kill myself"  Ideas of Reference:None  Suicidal Thoughts: Currently denies any suicidal ideations, plans or intent.  Homicidal Thoughts: Denies  Sensorium  Memory:Immediate Good; Recent Good; Remote Good  Judgment:Fair  Insight:Lacking  Executive Functions  Concentration:Fair  Attention Span:Good  Recall:Good  Fund of Knowledge:Good  Language:Good  Psychomotor Activity  Psychomotor Activity:Psychomotor Activity: Normal  Assets  Assets:Communication Skills; Desire for Improvement; Resilience; Social Support  Sleep  Sleep: 6.75  Physical Exam: Physical Exam Vitals and nursing note reviewed.  Constitutional:      General: Phillip Marquez is not in acute distress.    Appearance: Phillip Marquez is normal weight. Phillip Marquez is not ill-appearing or toxic-appearing.  HENT:     Head: Normocephalic.  Pulmonary:     Effort: Pulmonary effort is normal.  Musculoskeletal:        General: Normal range of motion.     Cervical back: Normal range of motion.  Neurological:     General: No focal deficit present.     Mental Status: Phillip Marquez is alert.     Motor: No weakness.   Review of Systems  Constitutional: Negative.  Negative for fever.  HENT: Negative.  Negative for congestion.   Respiratory: Negative.  Negative for cough and shortness of breath.   Cardiovascular: Negative.  Negative for chest pain.  Gastrointestinal: Negative.  Negative for diarrhea, nausea and vomiting.  Genitourinary: Negative.   Musculoskeletal: Negative.   Neurological:  Positive for headaches.  Psychiatric/Behavioral:  Positive for depression, hallucinations and suicidal ideas. The patient is  nervous/anxious.   All other systems reviewed and are negative.  Blood pressure 95/65, pulse (!) 55, temperature 98.3 F (36.8 C), temperature source Oral, resp. rate 20, height 6' (1.829 m), weight 70.3 kg, SpO2 100 %.  Body mass index is 21.02 kg/m.  Treatment Plan Summary: Daily contact with patient to assess and evaluate symptoms and progress in treatment and Medication management.   Continue inpatient hospitalization.  Will continue today 06/11/2021 plan as below except where it is noted.   Labs reviewed 10/15:  Lipid profile with cholesterol 273 and LDL 212. A1c 4.8   EKG results 10/15:  Vent. rate 62 BPM PR interval 168 ms QRS duration 102 ms QT/QTcB 438/444 ms P-R-T axes 73 77 98 Normal sinus rhythm Cannot rule out Inferior infarct , age undetermined Anterior infarct , age undetermined T wave abnormality, consider lateral ischemia Abnormal ECG  Compared with EKG from [facility] on 05/26/2021: Diagnosis Class Abnormal  Acquisition Device D3K  Ventricular Rate 71  Atrial Rate 71  P-R Interval 168  QRS Duration 102  Q-T Interval 394  QTC Calculation(Bazett) 428  Calculated P Axis 64  Calculated R Axis 75  Calculated T Axis 54   Diagnosis Normal sinus rhythm  Cannot rule out Inferior infarct (cited on or before 07-Jan-2018)  Anteroseptal infarct (cited on or before 12-Nov-2014)  Abnormal ECG  When compared with ECG of 18-Apr-2021 11:04,  No significant change was found   Alcohol/benzodiazepine withdrawal symptoms.  CIWA Ativan taper completed.   Anticoagulation therapy:  Continue Lovenox to Coumadin bridge dosing as recommended until INR is above 2. *PT-INR results reviewed: PT 18.8, INR 1.6.   Bipolar disorder - current episode depressed  Continue Zyprexa 5 mg po daily in the morning for racing thoughts & anxiety during the day. (Completed) Zyprexa 2.5 mg one time dose today to equal 5 mg with the already 2.5 mg received earlier this morning.  Continue  Olanzapine to 15 mg PO QHS starting tonight 06-10-21. Continue Sertraline 75 mg once daily starting 06-11-21.  Continue Vistaril 25 mg PO every 6 hrs prn. Continue Trazodone 150 mg po Q bedtime.  Other prn medication:  Continue Pindolol 2.5 mg po bid for A-fib. Continue colace for stool softener  Acetaminophen 650 mg PO every 6 hrs prn for pain/fever. Zofran-ODT 4 mg PO every 6 hrs prn for N/V. Thiamine 100 mg PO daily for thiamine replacement. Multivitamin 1 tab PO daily for nutritional supplementation.   Nicotine withdrawal symptoms. Continue nicotine patch 14 mg  trans-dermally every 24 hours.   Continue every 15 minute safety checks Encourage participation in the therapeutic milieu Discharge planning in progress - Patient will need PCP appointment for follow up for hyperlipidemia and cardiology appointment or referral for cardiomyopathy and anticoagulant therapy   Phillip Stammer, Phillip Marquez, Phillip Marquez, Phillip Marquez 06/11/2021, 2:35 PM  Patient ID: Tibor Lemmons, male   DOB: 1961-04-04, 60 y.o.   MRN: [id number] Patient ID: Briton Sellman, male   DOB: [birth year], 60 y.o.   MRN: [id number] Patient ID: Meyer Arora, male   DOB: 1961-06-21, 60 y.o.   MRN: [id number] Patient ID: Wissam Resor, male   DOB: 1960-11-25, 60 y.o.   MRN: [id number]

## 2021-06-11 NOTE — Progress Notes (Signed)
   06/11/21 2307  Psych Admission Type (Psych Patients Only)  Admission Status Voluntary  Psychosocial Assessment  Patient Complaints Anxiety  Eye Contact Fair  Facial Expression Anxious  Affect Appropriate to circumstance  Speech Logical/coherent  Interaction Minimal  Motor Activity Slow  Appearance/Hygiene Unremarkable  Behavior Characteristics Appropriate to situation  Mood Depressed  Thought Process  Coherency WDL  Content WDL  Delusions None reported or observed  Perception WDL  Hallucination None reported or observed  Judgment Impaired  Confusion None  Danger to Self  Current suicidal ideation? Denies  Self-Injurious Behavior No self-injurious ideation or behavior indicators observed or expressed   Agreement Not to Harm Self Yes  Description of Agreement Verbal contract  Danger to Others  Danger to Others None reported or observed

## 2021-06-11 NOTE — Progress Notes (Signed)
Psychoeducational Group Note  Date:  06/11/2021 Time:  2015  Group Topic/Focus:  Wrap up group  Participation Level: Did Not Attend  Participation Quality:  Not Applicable  Affect:  Not Applicable  Cognitive:  Not Applicable  Insight:  Not Applicable  Engagement in Group: Not Applicable  Additional Comments:  Did not attend.   Marcille Buffy 06/11/2021, 9:42 PM

## 2021-06-11 NOTE — Progress Notes (Signed)
Pt did not attend Orientation group. 

## 2021-06-11 NOTE — Group Note (Signed)
LCSW Group Therapy Note  No therapy group could be held this day due to staffing issues.  Another licensed group was held.  Elide Stalzer Grossman-Orr, LCSW 06/11/2021 11:41 AM     

## 2021-06-11 NOTE — Progress Notes (Signed)
ANTICOAGULATION CONSULT NOTE - Follow Up Consult  Pharmacy Consult for :  coumadin/lovenox   Allergies  Allergen Reactions   Quetiapine Fumarate     Other reaction(s): Confusion   Ketorolac Tromethamine Rash   Lactose Intolerance (Gi) Diarrhea    Patient Measurements: Wt 76 kg   Vital Signs: BP: 95/65 (10/22 1158) Pulse Rate: 55 (10/22 1158)  Labs: Recent Labs    06/08/21 1826 06/10/21 0729 06/11/21 0658  LABPROT 16.6* 16.4* 18.8*  INR 1.4* 1.3* 1.6*     Estimated Creatinine Clearance: 85.7 mL/min (by C-G formula based on SCr of 0.9 mg/dL).   Medications:  Scheduled:   enoxaparin (LOVENOX) injection  75 mg Subcutaneous Q12H   multivitamin with minerals  1 tablet Oral Daily   nicotine  14 mg Transdermal Daily   OLANZapine  15 mg Oral QHS   OLANZapine  5 mg Oral Daily   pindolol  2.5 mg Oral BID   sertraline  75 mg Oral Daily   thiamine  100 mg Oral Daily   traZODone  200 mg Oral QHS   warfarin  5 mg Oral ONCE-1600   Warfarin - Pharmacist Dosing Inpatient   Does not apply q1600    Assessment: INR =1.6 today.   No problems noted with therapy.  Pt currently receiving Lovenox 75mg  sq q12.   Goal of Therapy:  INR 2-3    Plan:  Coumadin 5 mg x1 today  PT/INR daily    06/11/2021,1:28 PM

## 2021-06-12 LAB — PROTIME-INR
INR: 1.8 — ABNORMAL HIGH (ref 0.8–1.2)
Prothrombin Time: 21 seconds — ABNORMAL HIGH (ref 11.4–15.2)

## 2021-06-12 MED ORDER — WARFARIN SODIUM 7.5 MG PO TABS
7.5000 mg | ORAL_TABLET | Freq: Once | ORAL | Status: AC
  Administered 2021-06-12: 7.5 mg via ORAL
  Filled 2021-06-12: qty 1

## 2021-06-12 NOTE — Progress Notes (Signed)
Psychoeducational Group Note  Date:  06/12/2021 Time:  2015  Group Topic/Focus:  Wrap up group  Participation Level: Did Not Attend  Participation Quality:  Not Applicable  Affect:  Not Applicable  Cognitive:  Not Applicable  Insight:  Not Applicable  Engagement in Group: Not Applicable  Additional Comments:  Did not attend.   Marcille Buffy 06/12/2021, 9:44 PM

## 2021-06-12 NOTE — [facility] Group Notes (Signed)
Psychoeducational Group Note  Date: 06-12-21 Time:  1300  Group Topic/Focus:  Making Healthy Choices:   The focus of this group is to help patients identify negative/unhealthy choices they were using prior to admission and identify positive/healthier coping strategies to replace them upon discharge.In this group, patients started asking about the brain and how the brain works with and how the chemicals work for those who use substances, the pros and cons of saboxone.  Participation Level:  Did not attend   Phillip Marquez A 

## 2021-06-12 NOTE — [facility] Group Notes (Signed)
Adult Psychoeducational Group Not Date:  06/12/2021 Time:  0900-1045 Group Topic/Focus: PROGRESSIVE RELAXATION. A group where deep breathing is taught and tensing and relaxation muscle groups is used. Imagery is used as well.  Pts are asked to imagine 3 pillars that hold them up when they are not able to hold themselves up.  Participation Level:  Did not attend  Additional Comments:    Dione Housekeeper

## 2021-06-12 NOTE — Progress Notes (Signed)
[facility] MD Progress Note  06/12/2021 1:40 PM Phillip Marquez  MRN:  [id number]  Subjective: Phillip Marquez reports, "I did not get enough sleep last night. My mood  is not good. I don't think my medicines are strong enough to help me. I'm not getting much rest at night". Daily notes: Patient is seen. Chart reviewed. The chart findings discussed with the treatment team.  Phillip Marquez presents alert, oriented & aware of situation. He is visible on the unit, attending group sessions. He continues to endorse having anxiety symptoms, not sleeping well at night. He is still requesting the dose of the Olanzapine to be increased. Still endorsing auditory hallucinations telling him to kill himself. He currently denies any SIHI, VH, delusional thoughts or paranoia. He does not appear to be responding to any internal stimuli. We have adjusted the doses of his Zyprexa up to 15 mg Q hs for bipolar disorder & Sertraline to 75 mg for depression. He is taking & tolerating his treatment regimen. Denies any side effects. Patient is encouraged to continue to attend group sessions. His PT-INR result reviewed. PT 21.0 & INR 1.8. Will continue patient's plan of care as already in progress. Vital signs: stable. Slept for 6.0 hrs per documentation.  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 He has been a regular patient in another psychiatric hospital x multiple times all related to substance use & bipolar disorder symptoms.  Phillip Marquez is admitted to [facility] from the [facility] hospital with complaints of worsening alcohol use, benzodiazepine use & suicidal ideations with a plan to drive his car into a bridge. His UDS was positive for benzodiazepine & has a BAL of <10.   See MAR for Lovenox to Coumadin bridge dosing.   Per previous notes: He has a cardiac history of cardiomyopathy and a left ventricular  thrombus 2.73 x 1.14 in size. (See 04/06/2021 [facility] in chart review). He was recommended for anticoagulant therapy and was bridged from Lovenox to Coumadin, however he did not follow up after he was discharged and he continues to drink heavily. Medicine was consulted (see note in chart for recommendations), and  discussed his options with him and the risks and benefits of anticoagulation versus doing nothing he stated he will think about what he wants to do. He stated he did not take it seriously in August.   Principal Problem: Bipolar depression (HCC)  Diagnosis: Principal Problem:   Bipolar depression (HCC) Active Problems:   Alcohol use disorder, severe, dependence (HCC)   Severe benzodiazepine use disorder (HCC)  Total Time spent with patient: 15 minutes  Past Psychiatric History:  Yes, Bipolar disorder, alcohol use disorder, cocaine use disorder.  Past Medical History: History reviewed. No pertinent past medical history. History reviewed. No pertinent surgical history.  Family History: History reviewed. No pertinent family history.  Family Psychiatric  History: Bipolar disorder: Sister  Social History:  Social History   Substance and Sexual Activity  Alcohol Use None     Social History   Substance and Sexual Activity  Drug Use Not on file    Social History   Socioeconomic History   Marital status: Divorced    Spouse name: Not on file   Number of children: Not on file   Years of education: Not on file   Highest education level: Not on file  Occupational History   Not on file  Tobacco Use   Smoking status: Every Day    Packs/day: 1.00    Years: 10.00    Pack years: 10.00    Types: Cigarettes   Smokeless tobacco: Never  Substance and Sexual Activity   Alcohol use: Not on file   Drug use: Not on file   Sexual activity: Not on file  Other Topics Concern   Not on file  Social History Narrative   Not on file   Social Determinants of Health   Financial  Resource Strain: Not on file  Food Insecurity: Not on file  Transportation Needs: Not on file  Physical Activity: Not on file  Stress: Not on file  Social Connections: Not on file   Additional Social History:  Divorced, has 1 son, employed, from [place], but lives in [place], [place].  Sleep: Fair  Appetite:  Fair  Current Medications: Current Facility-Administered Medications  Medication Dose Route Frequency Provider Last Rate Last Admin   acetaminophen (TYLENOL) tablet 650 mg  650 mg Oral Q6H PRN Comer Locket, MD   650 mg at 06/09/21 1403   alum & mag hydroxide-simeth (MAALOX/MYLANTA) 200-200-20 MG/5ML suspension 30 mL  30 mL Oral Q4H PRN Melbourne Abts W, PA-C   30 mL at 06/10/21 2006   docusate sodium (COLACE) capsule 100 mg  100 mg Oral Daily PRN Phineas Inches, MD   100 mg at 06/07/21 2117   enoxaparin (LOVENOX) injection 75 mg  75 mg Subcutaneous Q12H Comer Locket, MD   75 mg at 06/12/21 2229   hydrOXYzine (ATARAX/VISTARIL) tablet 25 mg  25 mg Oral TID PRN Jackelyn Poling, NP   25 mg at 06/11/21 2131   magnesium hydroxide (MILK OF MAGNESIA) suspension 30 mL  30 mL Oral Daily PRN Melbourne Abts W, PA-C       multivitamin with minerals tablet 1 tablet  1 tablet Oral Daily Mason Jim, Amy E, MD   1 tablet at 06/09/21 0750   nicotine (NICODERM CQ - dosed in mg/24 hours) patch 14 mg  14 mg Transdermal Daily Nira Conn A, NP       OLANZapine (ZYPREXA) tablet 15 mg  15 mg Oral QHS Montrice Montuori, Nicole Kindred I, NP   15 mg at 06/11/21 2131   OLANZapine (ZYPREXA) tablet 5 mg  5 mg Oral Daily Armandina Stammer I, NP   5 mg at 06/12/21 0741   pindolol (VISKEN) tablet 2.5 mg  2.5 mg Oral BID Massengill, Harrold Donath, MD   2.5 mg at 06/12/21 0740   sertraline (ZOLOFT) tablet 75 mg  75 mg Oral Daily Armandina Stammer I, NP   75 mg at 06/12/21 0741   thiamine tablet 100 mg  100 mg Oral Daily Ophelia Shoulder E, NP   100 mg at 06/12/21 7989   traZODone (DESYREL) tablet 200 mg  200 mg Oral QHS Armandina Stammer I, NP   200  mg at 06/11/21 2131   warfarin (COUMADIN) tablet 7.5 mg  7.5 mg Oral ONCE-1600 Lorenza Evangelist, [facility]       Warfarin - Pharmacist Dosing Inpatient   Does not apply Q1194 Comer Locket, MD   1 each at 06/11/21 1719   Lab Results:  Results for orders placed or performed during the hospital encounter of 06/02/21 (from the past 48 hour(s))  Protime-INR     Status: Abnormal   Collection Time: 06/11/21  6:58 AM  Result Value Ref Range   Prothrombin Time 18.8 (H) 11.4 - 15.2 seconds   INR 1.6 (  H) 0.8 - 1.2    Comment: (NOTE) INR goal varies based on device and disease states. Performed at [facility], [address] [place], [place] [address]   Protime-INR     Status: Abnormal   Collection Time: 06/12/21  6:35 AM  Result Value Ref Range   Prothrombin Time 21.0 (H) 11.4 - 15.2 seconds   INR 1.8 (H) 0.8 - 1.2    Comment: (NOTE) INR goal varies based on device and disease states. Performed at [facility], [address] [place], [place] [address]    Blood Alcohol level:  Lab Results  Component Value Date   ETH <10 05/31/2021   Metabolic Disorder Labs: Lab Results  Component Value Date   HGBA1C 4.8 06/04/2021   MPG 91.06 06/04/2021   No results found for: PROLACTIN Lab Results  Component Value Date   CHOL 273 (H) 06/04/2021   TRIG 116 06/04/2021   HDL 38 (L) 06/04/2021   CHOLHDL 7.2 06/04/2021   VLDL 23 06/04/2021   LDLCALC 212 (H) 06/04/2021   Physical Findings: AIMS:  , ,  ,  ,    CIWA:  CIWA-Ar Total: 0 COWS:     Musculoskeletal: Strength & Muscle Tone: within normal limits Gait & Station: normal Patient leans: N/A  Psychiatric Specialty Exam:  Presentation  General Appearance: Appropriate for Environment; Casual; Fairly Groomed  Eye Contact:Good  Speech:Clear and Coherent; Normal Rate  Speech Volume:Normal  Handedness:Right  Mood and Affect  Mood:Anxious; Depressed  Affect:Appropriate;  Non-Congruent  Thought Process  Thought Processes:Coherent; Linear  Descriptions of Associations:Intact  Orientation:Full (Time, Place and Person)  Thought Content:Logical  History of Schizophrenia/Schizoaffective disorder: No Duration of Psychotic Symptoms: NA Hallucinations: "Yes, auditory hallucinations telling me to kill myself"  Ideas of Reference:None  Suicidal Thoughts: Currently denies any suicidal ideations, plans or intent.  Homicidal Thoughts: Denies  Sensorium  Memory:Immediate Good; Recent Good; Remote Good  Judgment:Fair  Insight:Lacking  Executive Functions  Concentration:Fair  Attention Span:Good  Recall:Good  Fund of Knowledge:Good  Language:Good  Psychomotor Activity  Psychomotor Activity:Psychomotor Activity: Normal  Assets  Assets:Communication Skills; Desire for Improvement; Resilience; Social Support  Sleep  Sleep: 6.0  Physical Exam: Physical Exam Vitals and nursing note reviewed.  Constitutional:      General: He is not in acute distress.    Appearance: He is normal weight. He is not ill-appearing or toxic-appearing.  HENT:     Head: Normocephalic.  Pulmonary:     Effort: Pulmonary effort is normal.  Musculoskeletal:        General: Normal range of motion.     Cervical back: Normal range of motion.  Neurological:     General: No focal deficit present.     Mental Status: He is alert.     Motor: No weakness.   Review of Systems  Constitutional: Negative.  Negative for fever.  HENT: Negative.  Negative for congestion.   Respiratory: Negative.  Negative for cough and shortness of breath.   Cardiovascular: Negative.  Negative for chest pain.  Gastrointestinal: Negative.  Negative for diarrhea, nausea and vomiting.  Genitourinary: Negative.   Musculoskeletal: Negative.   Neurological:  Positive for headaches.  Psychiatric/Behavioral:  Positive for depression, hallucinations and suicidal ideas. The patient is nervous/anxious.    All other systems reviewed and are negative.  Blood pressure 119/76, pulse 68, temperature 98 F (36.7 C), temperature source Oral, resp. rate 20, height 6' (1.829 m), weight 70.3 kg, SpO2 99 %. Body mass  index is 21.02 kg/m.  Treatment Plan Summary: Daily contact with patient to assess and evaluate symptoms and progress in treatment and Medication management.   Continue inpatient hospitalization.  Will continue today 06/12/2021 plan as below except where it is noted.   Labs reviewed 10/15:  Lipid profile with cholesterol 273 and LDL 212. A1c 4.8   EKG results 10/15:  Vent. rate 62 BPM PR interval 168 ms QRS duration 102 ms QT/QTcB 438/444 ms P-R-T axes 73 77 98 Normal sinus rhythm Cannot rule out Inferior infarct , age undetermined Anterior infarct , age undetermined T wave abnormality, consider lateral ischemia Abnormal ECG  Compared with EKG from [facility] on 05/26/2021: Diagnosis Class Abnormal  Acquisition Device D3K  Ventricular Rate 71  Atrial Rate 71  P-R Interval 168  QRS Duration 102  Q-T Interval 394  QTC Calculation(Bazett) 428  Calculated P Axis 64  Calculated R Axis 75  Calculated T Axis 54   Diagnosis Normal sinus rhythm  Cannot rule out Inferior infarct (cited on or before 07-Jan-2018)  Anteroseptal infarct (cited on or before 12-Nov-2014)  Abnormal ECG  When compared with ECG of 18-Apr-2021 11:04,  No significant change was found   Alcohol/benzodiazepine withdrawal symptoms.  CIWA Ativan taper completed.   Anticoagulation therapy:  Continue Lovenox to Coumadin bridge dosing as recommended until INR is above 2. *PT-INR results reviewed: PT 18.8, INR 1.6.   Bipolar disorder - current episode depressed  Continue Zyprexa 5 mg po daily in the morning for racing thoughts & anxiety during the day. (Completed) Zyprexa 2.5 mg one time dose today to equal 5 mg with the already 2.5 mg received earlier this morning.  Continue Olanzapine 15 mg PO QHS  starting tonight 06-10-21. Continue Sertraline 75 mg once daily starting 06-11-21.  Continue Vistaril 25 mg PO every 6 hrs prn. Continue Trazodone 150 mg po Q bedtime.  Other prn medication:  Continue Pindolol 2.5 mg po bid for A-fib. Continue colace for stool softener  Acetaminophen 650 mg PO every 6 hrs prn for pain/fever. Zofran-ODT 4 mg PO every 6 hrs prn for N/V. Thiamine 100 mg PO daily for thiamine replacement. Multivitamin 1 tab PO daily for nutritional supplementation.   Nicotine withdrawal symptoms. Continue nicotine patch 14 mg  trans-dermally every 24 hours.   Continue every 15 minute safety checks Encourage participation in the therapeutic milieu Discharge planning in progress - Patient will need PCP appointment for follow up for hyperlipidemia and cardiology appointment or referral for cardiomyopathy and anticoagulant therapy   Armandina Stammer, NP, pmhnp, fnp-bc 06/12/2021, 1:40 PM Patient ID: Phillip [facility], male   DOB: June 29, 1961, 60 y.o.   MRN: [id number]

## 2021-06-12 NOTE — Progress Notes (Addendum)
   06/12/21 1200  Psych Admission Type (Psych Patients Only)  Admission Status Voluntary  Psychosocial Assessment  Patient Complaints Depression  Eye Contact Fair  Facial Expression Anxious  Affect Appropriate to circumstance  Speech Logical/coherent  Interaction Minimal  Motor Activity Slow  Appearance/Hygiene Unremarkable  Behavior Characteristics Cooperative;Appropriate to situation  Mood Depressed  Aggressive Behavior  Effect No apparent injury  Thought Process  Coherency WDL  Content WDL  Delusions None reported or observed  Perception WDL  Hallucination None reported or observed  Judgment Impaired  Confusion None  Danger to Self  Current suicidal ideation? Denies  Self-Injurious Behavior No self-injurious ideation or behavior indicators observed or expressed   Agreement Not to Harm Self Yes  Description of Agreement Verbal contract  Danger to Others  Danger to Others None reported or observed    D. Pt continues to present with a depressed affect/ mood- brightens a little during interactions. Pt continues to endorse command AH - voices telling him to kill himself. Pt remained in bed this morning and did not attend groups despite encouragement from staff.  A. Labs and vitals monitored. Pt given and educated on medications. Pt supported emotionally and encouraged to express concerns and ask questions.   R. Pt remains safe with 15 minute checks. Will continue POC.

## 2021-06-12 NOTE — Progress Notes (Signed)
   06/12/21 2311  Psych Admission Type (Psych Patients Only)  Admission Status Voluntary  Psychosocial Assessment  Patient Complaints Depression  Eye Contact Fair  Facial Expression Flat  Affect Appropriate to circumstance  Speech Logical/coherent  Interaction Minimal  Motor Activity Slow  Appearance/Hygiene Unremarkable  Behavior Characteristics Appropriate to situation  Mood Depressed  Thought Process  Coherency WDL  Content WDL  Delusions None reported or observed  Hallucination None reported or observed  Judgment Impaired  Confusion None  Danger to Self  Current suicidal ideation? Denies  Self-Injurious Behavior No self-injurious ideation or behavior indicators observed or expressed   Agreement Not to Harm Self Yes  Description of Agreement Verbal contract  Danger to Others  Danger to Others None reported or observed

## 2021-06-12 NOTE — Progress Notes (Signed)
ANTICOAGULATION CONSULT NOTE - Follow Up Consult  Pharmacy Consult for :  coumadin/lovenox   Allergies  Allergen Reactions   Quetiapine Fumarate     Other reaction(s): Confusion   Ketorolac Tromethamine Rash   Lactose Intolerance (Gi) Diarrhea    Patient Measurements: Wt 76 kg   Vital Signs: Temp: 98 F (36.7 C) (10/23 0627) Temp Source: Oral (10/23 0627) BP: 119/76 (10/23 0627) Pulse Rate: 58 (10/23 0627)  Labs: Recent Labs    06/10/21 0729 06/11/21 0658 06/12/21 0635  LABPROT 16.4* 18.8* 21.0*  INR 1.3* 1.6* 1.8*     Estimated Creatinine Clearance: 85.7 mL/min (by C-G formula based on SCr of 0.9 mg/dL).   Medications:  Scheduled:   enoxaparin (LOVENOX) injection  75 mg Subcutaneous Q12H   multivitamin with minerals  1 tablet Oral Daily   nicotine  14 mg Transdermal Daily   OLANZapine  15 mg Oral QHS   OLANZapine  5 mg Oral Daily   pindolol  2.5 mg Oral BID   sertraline  75 mg Oral Daily   thiamine  100 mg Oral Daily   traZODone  200 mg Oral QHS   Warfarin - Pharmacist Dosing Inpatient   Does not apply q1600    Assessment: INR =1.8 today.   No problems noted with therapy.  Pt currently receiving Lovenox 75mg  sq q12.   Goal of Therapy:  INR 2-3    Plan:  Coumadin 7.5 mg x1 today  PT/INR daily    06/12/2021,10:14 AM

## 2021-06-13 ENCOUNTER — Encounter (HOSPITAL_COMMUNITY): Payer: Self-pay

## 2021-06-13 LAB — PROTIME-INR
INR: 2 — ABNORMAL HIGH (ref 0.8–1.2)
Prothrombin Time: 22.8 seconds — ABNORMAL HIGH (ref 11.4–15.2)

## 2021-06-13 MED ORDER — WARFARIN SODIUM 7.5 MG PO TABS
7.5000 mg | ORAL_TABLET | Freq: Every day | ORAL | Status: DC
  Administered 2021-06-13: 7.5 mg via ORAL
  Filled 2021-06-13 (×3): qty 1

## 2021-06-13 MED ORDER — PINDOLOL 5 MG PO TABS: 5.0000 mg | ORAL_TABLET | Freq: Two times a day (BID) | ORAL | Status: DC

## 2021-06-13 MED ORDER — WARFARIN SODIUM 5 MG PO TABS
5.0000 mg | ORAL_TABLET | Freq: Once | ORAL | Status: DC
  Filled 2021-06-13: qty 1

## 2021-06-13 MED ORDER — SERTRALINE HCL 25 MG PO TABS
75.0000 mg | ORAL_TABLET | Freq: Every day | ORAL | Status: DC
  Filled 2021-06-13 (×2): qty 21

## 2021-06-13 MED ORDER — WARFARIN SODIUM 2.5 MG PO TABS
2.5000 mg | ORAL_TABLET | Freq: Every day | ORAL | Status: DC
  Filled 2021-06-13 (×2): qty 1

## 2021-06-13 MED ORDER — PINDOLOL 5 MG PO TABS
2.5000 mg | ORAL_TABLET | Freq: Two times a day (BID) | ORAL | Status: DC
  Administered 2021-06-14: 2.5 mg via ORAL
  Filled 2021-06-13 (×7): qty 1

## 2021-06-13 NOTE — BH IP Treatment Plan (Signed)
Interdisciplinary Treatment and Diagnostic Plan Update  06/13/2021 Time of Session: 9:10am Phillip [facility] MRN: [id number]  Principal Diagnosis: Bipolar depression (HCC)  Secondary Diagnoses: Principal Problem:   Bipolar depression (HCC) Active Problems:   Alcohol use disorder, severe, dependence (HCC)   Severe benzodiazepine use disorder (HCC)   Current Medications:  Current Facility-Administered Medications  Medication Dose Route Frequency Provider Last Rate Last Admin   acetaminophen (TYLENOL) tablet 650 mg  650 mg Oral Q6H PRN Comer Locket, MD   650 mg at 06/09/21 1403   alum & mag hydroxide-simeth (MAALOX/MYLANTA) 200-200-20 MG/5ML suspension 30 mL  30 mL Oral Q4H PRN Jaclyn Shaggy, PA-C   30 mL at 06/10/21 2006   docusate sodium (COLACE) capsule 100 mg  100 mg Oral Daily PRN Phineas Inches, MD   100 mg at 06/07/21 2117   hydrOXYzine (ATARAX/VISTARIL) tablet 25 mg  25 mg Oral TID PRN Jackelyn Poling, NP   25 mg at 06/12/21 2144   magnesium hydroxide (MILK OF MAGNESIA) suspension 30 mL  30 mL Oral Daily PRN Melbourne Abts W, PA-C       multivitamin with minerals tablet 1 tablet  1 tablet Oral Daily Mason Jim, Amy E, MD   1 tablet at 06/09/21 0750   nicotine (NICODERM CQ - dosed in mg/24 hours) patch 14 mg  14 mg Transdermal Daily Nira Conn A, NP       OLANZapine (ZYPREXA) tablet 15 mg  15 mg Oral QHS Nwoko, Agnes I, NP   15 mg at 06/12/21 2144   OLANZapine (ZYPREXA) tablet 5 mg  5 mg Oral Daily Armandina Stammer I, NP   5 mg at 06/13/21 4166   pindolol (VISKEN) tablet 2.5 mg  2.5 mg Oral BID Massengill, Harrold Donath, MD   2.5 mg at 06/13/21 0818   sertraline (ZOLOFT) tablet 75 mg  75 mg Oral Daily Armandina Stammer I, NP   75 mg at 06/13/21 0820   thiamine tablet 100 mg  100 mg Oral Daily Ophelia Shoulder E, NP   100 mg at 06/12/21 0630   traZODone (DESYREL) tablet 200 mg  200 mg Oral QHS Nwoko, Nicole Kindred I, NP   200 mg at 06/12/21 2144   [START ON 06/15/2021] warfarin (COUMADIN) tablet 5 mg  5 mg  Oral Once Bartholomew Crews E, MD       warfarin (COUMADIN) tablet 7.5 mg  7.5 mg Oral q1600 Comer Locket, MD       Warfarin - Pharmacist Dosing Inpatient   Does not apply [id number] Comer Locket, MD   1 each at 06/11/21 1719   PTA Medications: Medications Prior to Admission  Medication Sig Dispense Refill Last Dose   acetaminophen (TYLENOL) 500 MG tablet Take 1,000 mg by mouth every 6 (six) hours as needed for mild pain.       Patient Stressors:    Patient Strengths:    Treatment Modalities: Medication Management, Group therapy, Case management,  1 to 1 session with clinician, Psychoeducation, Recreational therapy.   Physician Treatment Plan for Primary Diagnosis: Bipolar depression (HCC) Long Term Goal(s): Improvement in symptoms so as ready for discharge   Short Term Goals: Ability to identify changes in lifestyle to reduce recurrence of condition will improve Ability to verbalize feelings will improve Ability to disclose and discuss suicidal ideas Ability to demonstrate self-control will improve Ability to identify and develop effective coping behaviors will improve Ability to maintain clinical measurements within normal limits will improve Compliance with prescribed medications will  improve Ability to identify triggers associated with substance abuse/mental health issues will improve  Medication Management: Evaluate patient's response, side effects, and tolerance of medication regimen.  Therapeutic Interventions: 1 to 1 sessions, Unit Group sessions and Medication administration.  Evaluation of Outcomes: Progressing  Physician Treatment Plan for Secondary Diagnosis: Principal Problem:   Bipolar depression (HCC) Active Problems:   Alcohol use disorder, severe, dependence (HCC)   Severe benzodiazepine use disorder (HCC)  Long Term Goal(s): Improvement in symptoms so as ready for discharge   Short Term Goals: Ability to identify changes in lifestyle to reduce recurrence  of condition will improve Ability to verbalize feelings will improve Ability to disclose and discuss suicidal ideas Ability to demonstrate self-control will improve Ability to identify and develop effective coping behaviors will improve Ability to maintain clinical measurements within normal limits will improve Compliance with prescribed medications will improve Ability to identify triggers associated with substance abuse/mental health issues will improve     Medication Management: Evaluate patient's response, side effects, and tolerance of medication regimen.  Therapeutic Interventions: 1 to 1 sessions, Unit Group sessions and Medication administration.  Evaluation of Outcomes: Progressing   RN Treatment Plan for Primary Diagnosis: Bipolar depression (HCC) Long Term Goal(s): Knowledge of disease and therapeutic regimen to maintain health will improve  Short Term Goals: Ability to remain free from injury will improve, Ability to verbalize frustration and anger appropriately will improve, Ability to demonstrate self-control, Ability to identify and develop effective coping behaviors will improve, and Compliance with prescribed medications will improve  Medication Management: RN will administer medications as ordered by provider, will assess and evaluate patient's response and provide education to patient for prescribed medication. RN will report any adverse and/or side effects to prescribing provider.  Therapeutic Interventions: 1 on 1 counseling sessions, Psychoeducation, Medication administration, Evaluate responses to treatment, Monitor vital signs and CBGs as ordered, Perform/monitor CIWA, COWS, AIMS and Fall Risk screenings as ordered, Perform wound care treatments as ordered.  Evaluation of Outcomes: Progressing   LCSW Treatment Plan for Primary Diagnosis: Bipolar depression (HCC) Long Term Goal(s): Safe transition to appropriate next level of care at discharge, Engage patient in  therapeutic group addressing interpersonal concerns.  Short Term Goals: Engage patient in aftercare planning with referrals and resources, Increase social support, Increase ability to appropriately verbalize feelings, Identify triggers associated with mental health/substance abuse issues, and Increase skills for wellness and recovery  Therapeutic Interventions: Assess for all discharge needs, 1 to 1 time with Social worker, Explore available resources and support systems, Assess for adequacy in community support network, Educate family and significant other(s) on suicide prevention, Complete Psychosocial Assessment, Interpersonal group therapy.  Evaluation of Outcomes: Progressing   Progress in Treatment: Attending groups: Yes. Participating in groups: Yes. Taking medication as prescribed: Yes. Toleration medication: Yes. Family/Significant other contact made: No, will contact:  Declined Consents  Patient understands diagnosis: Yes. Discussing patient identified problems/goals with staff: Yes. Medical problems stabilized or resolved: Yes. Denies suicidal/homicidal ideation: Yes. Issues/concerns per patient self-inventory: No.     New problem(s) identified: No, Describe:  None    New Short Term/Long Term Goal(s): medication stabilization, elimination of SI thoughts, development of comprehensive mental wellness plan.    Patient Goals: "To get off of alcohol and xanax"   Discharge Plan or Barriers: Patient is to return home and will follow up with [facility] while awaiting [facility] at [facility] or [facility]   Reason for Continuation of Hospitalization: Depression Medication stabilization     Estimated Length  of Stay: 1 day   Scribe for Treatment Team: Otelia Santee, LCSW 06/13/2021 10:50 AM

## 2021-06-13 NOTE — Group Note (Signed)
Recreation Therapy Group Note   Group Topic:Stress Management  Group Date: 06/13/2021 Start Time: 9509 End Time: 1050 Facilitators: Bjorn Loser, NT Location: [address] Dayroom  Goal Area(s) Addresses:  Patient will identify positive stress management techniques. Patient will identify benefits of using stress management post d/c.  Group Description:  Meditation.  LRT played a meditation that focused on having patience when dealing with situations beyond our control.  Patients were to sit and focus as the meditation played to fully engage.   Affect/Mood: N/A   Participation Level: Did not attend    Clinical Observations/Individualized Feedback: Pt did not attend group.    Plan: Continue to engage patient in RT group sessions 2-3x/week.   Caroll Rancher, LRT/CTRS 06/13/2021 1:15 PM

## 2021-06-13 NOTE — BHH Group Notes (Signed)
The focus of this group is to help patients establish daily goals to achieve during treatment and discuss how the patient can incorporate goal setting into their daily lives to aide in recovery.  Pt given worksheet 

## 2021-06-13 NOTE — BHH Group Notes (Signed)
Patient did not attend group.  Spiritual care group on grief and loss facilitated by chaplain Katy Phyllicia Dudek, BCC   Group Goal:   Support / Education around grief and loss   Members engage in facilitated group support and psycho-social education.   Group Description:   Following introductions and group rules, group members engaged in facilitated group dialog and support around topic of loss, with particular support around experiences of loss in their lives. Group Identified types of loss (relationships / self / things) and identified patterns, circumstances, and changes that precipitate losses. Reflected on thoughts / feelings around loss, normalized grief responses, and recognized variety in grief experience. Group noted Worden's four tasks of grief in discussion.   Group drew on Adlerian / Rogerian, narrative, MI,   Patient Progress:  

## 2021-06-13 NOTE — Progress Notes (Signed)
[facility] MD Progress Note  06/13/2021 9:54 AM Phillip Marquez  MRN:  [id number]  Subjective: Phillip Marquez stated "Nobody told me I was being discharged today. Nobody told me to call [facility] about beds. I did not have a discharge date to tell [facility], that is why I did not call them."  Evaluation on the unit today, 10/24: Patient is seen, chart reviewed and case discussed with the treatment team.  Patient became upset when he was told he would be discharging today. He stated he did not know this and has no place to go. He stated "me and my girlfriend aren't getting along and I can't go there." He stated he did not call [facility] every day because he did not have a discharge date to tell them. I reminded him that we previously discussed that he may have to discharge and call daily for bed availability because beds are scarce. I also reminded him that it is his responsibility to participate in his treatment plan and he has not been doing this. Dr Sherron Flemings was present and requested that patient call [facility] before lunch and give an update. Patient was seen in the hallway later and stated he called but they said call back after lunch. I asked him to let me know what they tell him. He is visible on the unit, attending group sessions. He continues to endorse having anxiety symptoms, not sleeping well at night. He is still requesting the dose of the Olanzapine to be increased. Still endorsing auditory hallucinations telling him to kill himself. He currently denies any SIHI, VH, delusional thoughts or paranoia. He does not appear to be responding to any internal stimuli. He continues on his dose of Zyprexa 15 mg Q hs and Zyprexa 5 mg daily for bipolar disorder & Sertraline to 75 mg for depression. He denies any medication side effects. Patient reported fair sleep and a good appetite. He slept 5.25 hours per record review. Patient is encouraged to continue to attend group sessions and be active in his treatment plan. His PT-INR result reviewed,  PT 22.8 & INR 2.0. Lovenox bridge discontinued, oral coumadin dosing per pharmacist recommendation, see MAR. Patient will likely be discharged tomorrow. Vital signs: stable.   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 He has been a regular patient in another psychiatric hospital x multiple times all related to substance use & bipolar disorder symptoms.  Phillip Marquez is admitted to [facility] from the [facility] hospital with complaints of worsening alcohol use, benzodiazepine use & suicidal ideations with a plan to drive his car into a bridge. His UDS was positive for benzodiazepine & has a BAL of <10.   See MAR for Lovenox to Coumadin bridge dosing.   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 (See 04/06/2021 [facility] in chart review). He was recommended for anticoagulant therapy and was bridged from Lovenox to Coumadin, however he did not follow up after he was discharged and he continues to drink heavily. Medicine was consulted (see note in chart for recommendations), and  discussed his options with him and the risks and benefits of anticoagulation versus doing nothing he stated he will think about what he wants to do. He stated he did not take it seriously in August.   Principal Problem: Bipolar depression (HCC)  Diagnosis: Principal Problem:  Bipolar depression (HCC) Active Problems:   Alcohol use disorder, severe, dependence (HCC)   Severe benzodiazepine use disorder (HCC)  Total Time spent with patient: 15 minutes  Past Psychiatric History:  Yes, Bipolar disorder, alcohol use disorder, cocaine use disorder.  Past Medical History: History reviewed. No pertinent past medical history. History reviewed. No pertinent surgical history.  Family History: History reviewed. No pertinent  family history.  Family Psychiatric  History: Bipolar disorder: Sister  Social History:  Social History   Substance and Sexual Activity  Alcohol Use None     Social History   Substance and Sexual Activity  Drug Use Not on file    Social History   Socioeconomic History   Marital status: Divorced    Spouse name: Not on file   Number of children: Not on file   Years of education: Not on file   Highest education level: Not on file  Occupational History   Not on file  Tobacco Use   Smoking status: Every Day    Packs/day: 1.00    Years: 10.00    Pack years: 10.00    Types: Cigarettes   Smokeless tobacco: Never  Substance and Sexual Activity   Alcohol use: Not on file   Drug use: Not on file   Sexual activity: Not on file  Other Topics Concern   Not on file  Social History Narrative   Not on file   Social Determinants of Health   Financial Resource Strain: Not on file  Food Insecurity: Not on file  Transportation Needs: Not on file  Physical Activity: Not on file  Stress: Not on file  Social Connections: Not on file   Additional Social History:  Divorced, has 1 son, employed, from [place], but lives in [place], [place].  Sleep: Fair  Appetite:  Fair  Current Medications: Current Facility-Administered Medications  Medication Dose Route Frequency Provider Last Rate Last Admin   acetaminophen (TYLENOL) tablet 650 mg  650 mg Oral Q6H PRN Phillip Locket, MD   650 mg at 06/09/21 1403   alum & mag hydroxide-simeth (MAALOX/MYLANTA) 200-200-20 MG/5ML suspension 30 mL  30 mL Oral Q4H PRN Phillip Shaggy, PA-C   30 mL at 06/10/21 2006   docusate sodium (COLACE) capsule 100 mg  100 mg Oral Daily PRN Phillip Inches, MD   100 mg at 06/07/21 2117   hydrOXYzine (ATARAX/VISTARIL) tablet 25 mg  25 mg Oral TID PRN Phillip Poling, NP   25 mg at 06/12/21 2144   magnesium hydroxide (MILK OF MAGNESIA) suspension 30 mL  30 mL Oral Daily PRN Phillip Abts W, PA-C        multivitamin with minerals tablet 1 tablet  1 tablet Oral Daily Phillip Jim, Amy E, MD   1 tablet at 06/09/21 0750   nicotine (NICODERM CQ - dosed in mg/24 hours) patch 14 mg  14 mg Transdermal Daily Nira Conn A, NP       OLANZapine (ZYPREXA) tablet 15 mg  15 mg Oral QHS Nwoko, Nicole [facility]red I, NP   15 mg at 06/12/21 2144   OLANZapine (ZYPREXA) tablet 5 mg  5 mg Oral Daily Armandina Stammer I, NP   5 mg at 06/13/21 3329   pindolol (VISKEN) tablet 2.5 mg  2.5 mg Oral BID Massengill, Harrold Donath, MD   2.5 mg at 06/13/21 0818   sertraline (ZOLOFT) tablet 75 mg  75 mg Oral Daily Armandina Stammer I, NP   75 mg at 06/13/21 0820   thiamine tablet  100 mg  100 mg Oral Daily Ophelia Shoulder E, NP   100 mg at 06/12/21 6203   traZODone (DESYREL) tablet 200 mg  200 mg Oral QHS Armandina Stammer I, NP   200 mg at 06/12/21 2144   [START ON 06/15/2021] warfarin (COUMADIN) tablet 5 mg  5 mg Oral Once Phillip Locket, MD       warfarin (COUMADIN) tablet 7.5 mg  7.5 mg Oral q1600 Phillip Locket, MD       Warfarin - Pharmacist Dosing Inpatient   Does not apply q1600 Phillip Locket, MD   1 each at 06/11/21 1719   Lab Results:  Results for orders placed or performed during the hospital encounter of 06/02/21 (from the past 48 hour(s))  Protime-INR     Status: Abnormal   Collection Time: 06/12/21  6:35 AM  Result Value Ref Range   Prothrombin Time 21.0 (H) 11.4 - 15.2 seconds   INR 1.8 (H) 0.8 - 1.2    Comment: (NOTE) INR goal varies based on device and disease states. Performed at [facility], [address], [place], [place] [address]   Protime-INR     Status: Abnormal   Collection Time: 06/13/21  6:44 AM  Result Value Ref Range   Prothrombin Time 22.8 (H) 11.4 - 15.2 seconds   INR 2.0 (H) 0.8 - 1.2    Comment: (NOTE) INR goal varies based on device and disease states. Performed at [facility], [address], [place], [place] [address]    Blood Alcohol level:  Lab Results  Component  Value Date   ETH <10 05/31/2021   Metabolic Disorder Labs: Lab Results  Component Value Date   HGBA1C 4.8 06/04/2021   MPG 91.06 06/04/2021   No results found for: PROLACTIN Lab Results  Component Value Date   CHOL 273 (H) 06/04/2021   TRIG 116 06/04/2021   HDL 38 (L) 06/04/2021   CHOLHDL 7.2 06/04/2021   VLDL 23 06/04/2021   LDLCALC 212 (H) 06/04/2021   Physical Findings: AIMS:  , ,  ,  ,    CIWA:  CIWA-Ar Total: 0 COWS:     Musculoskeletal: Strength & Muscle Tone: within normal limits Gait & Station: normal Patient leans: N/A  Psychiatric Specialty Exam:  Presentation  General Appearance: Appropriate for Environment; Casual; Fairly Groomed  Eye Contact:Good  Speech:Clear and Coherent; Normal Rate  Speech Volume:Normal  Handedness:Right  Mood and Affect  Mood:Anxious; Depressed  Affect:Appropriate; Non-Congruent  Thought Process  Thought Processes:Coherent; Linear  Descriptions of Associations:Intact  Orientation:Full (Time, Place and Person)  Thought Content:Logical  History of Schizophrenia/Schizoaffective disorder: No Duration of Psychotic Symptoms: NA Hallucinations: "Yes, auditory hallucinations telling me to kill myself"  Ideas of Reference:None  Suicidal Thoughts: Currently denies any suicidal ideations, plans or intent.  Homicidal Thoughts: Denies  Sensorium  Memory:Immediate Good; Recent Good; Remote Good  Judgment:Fair  Insight:Lacking  Executive Functions  Concentration:Fair  Attention Span:Good  Recall:Good  Fund of Knowledge:Good  Language:Good  Psychomotor Activity  Psychomotor Activity:No data recorded  Assets  Assets:Communication Skills; Desire for Improvement; Resilience; Social Support  Sleep  Sleep: 5.25  Physical Exam: Physical Exam Vitals and nursing note reviewed.  Constitutional:      General: He is not in acute distress.    Appearance: He is normal weight. He is not ill-appearing or  toxic-appearing.  HENT:     Head: Normocephalic.  Pulmonary:     Effort: Pulmonary effort is normal.  Musculoskeletal:  General: Normal range of motion.     Cervical back: Normal range of motion.  Neurological:     General: No focal deficit present.     Mental Status: He is alert.     Motor: No weakness.  Psychiatric:        Attention and Perception: Attention and perception normal. He does not perceive auditory or visual hallucinations.        Mood and Affect: Mood is anxious.        Speech: Speech normal.        Behavior: Behavior is uncooperative.        Thought Content: Thought content normal. Thought content is not paranoid or delusional. Thought content does not include homicidal or suicidal ideation. Thought content does not include homicidal or suicidal plan.        Cognition and Memory: Cognition normal.   Review of Systems  Constitutional: Negative.  Negative for fever.  HENT: Negative.  Negative for congestion.   Respiratory: Negative.  Negative for cough and shortness of breath.   Cardiovascular: Negative.  Negative for chest pain.  Gastrointestinal: Negative.  Negative for diarrhea, nausea and vomiting.  Genitourinary: Negative.   Musculoskeletal: Negative.   Neurological:  Positive for headaches.  Psychiatric/Behavioral:  Positive for depression, hallucinations and suicidal ideas. The patient is nervous/anxious.   All other systems reviewed and are negative.  Blood pressure 116/64, pulse 62, temperature 97.9 F (36.6 C), temperature source Oral, resp. rate 20, height 6' (1.829 m), weight 70.3 kg, SpO2 99 %. Body mass index is 21.02 kg/m.  Treatment Plan Summary: Daily contact with patient to assess and evaluate symptoms and progress in treatment and Medication management.   Continue inpatient hospitalization.  Will continue today 06/13/2021 plan as below except where it is noted.   Labs reviewed 10/15:  Lipid profile with cholesterol 273 and LDL 212. A1c  4.8   EKG results 10/15:  Vent. rate 62 BPM PR interval 168 ms QRS duration 102 ms QT/QTcB 438/444 ms P-R-T axes 73 77 98 Normal sinus rhythm Cannot rule out Inferior infarct , age undetermined Anterior infarct , age undetermined T wave abnormality, consider lateral ischemia Abnormal ECG  Compared with EKG from [facility] on 05/26/2021: Diagnosis Class Abnormal  Acquisition Device D3K  Ventricular Rate 71  Atrial Rate 71  P-R Interval 168  QRS Duration 102  Q-T Interval 394  QTC Calculation(Bazett) 428  Calculated P Axis 64  Calculated R Axis 75  Calculated T Axis 54   Diagnosis Normal sinus rhythm  Cannot rule out Inferior infarct (cited on or before 07-Jan-2018)  Anteroseptal infarct (cited on or before 12-Nov-2014)  Abnormal ECG  When compared with ECG of 18-Apr-2021 11:04,  No significant change was found   Alcohol/benzodiazepine withdrawal symptoms.  CIWA Ativan taper completed.   Anticoagulation therapy:  Discontinue Lovenox, per pharmacist Coumadin bridge dosing as recommended per pharmacist INR is above 2 on 06/13/21 *PT-INR results reviewed: PT 18.8, INR 2.1 Initiate Coumadin 7.5 mg on Monday and Tuesday then Initiate Coumadin 5 mg PO on Wednesday Outpatient will continue dosing after discharge. Patient has an outpatient PCP appointment on 10/27.     Bipolar disorder - current episode depressed  Continue Zyprexa 5 mg po daily in the morning for racing thoughts & anxiety during the day. (Completed) Zyprexa 2.5 mg one time dose today to equal 5 mg with the already 2.5 mg received earlier this morning.  Continue Olanzapine 15 mg PO QHS starting tonight 06-10-21. Continue  Sertraline 75 mg once daily starting 06-11-21.   Anxiety:  Continue Vistaril 25 mg PO every 6 hrs prn. Continue Pindolol 2.5 mg po bid for anxiety  Insomnia:  Continue Trazodone 200 mg po Q bedtime.  Other PRN medications:  Continue colace for stool softener  Acetaminophen 650 mg  PO every 6 hrs prn for pain/fever. Zofran-ODT 4 mg PO every 6 hrs prn for N/V. Thiamine 100 mg PO daily for thiamine replacement. Multivitamin 1 tab PO daily for nutritional supplementation.   Nicotine withdrawal symptoms. Continue nicotine patch 14 mg  trans-dermally every 24 hours.   Continue every 15 minute safety checks Encourage participation in the therapeutic milieu Discharge planning in progress - Patient will need PCP appointment for follow up for hyperlipidemia and cardiology appointment or referral for cardiomyopathy and anticoagulant therapy   Laveda Abbe, NP 06/13/2021, 3:17 PM

## 2021-06-13 NOTE — Progress Notes (Addendum)
   06/13/21 1300  Psych Admission Type (Psych Patients Only)  Admission Status Voluntary  Psychosocial Assessment  Patient Complaints Depression  Eye Contact Fair  Facial Expression Flat  Affect Appropriate to circumstance  Speech Logical/coherent  Interaction Minimal  Motor Activity Slow  Appearance/Hygiene Unremarkable  Behavior Characteristics Cooperative;Appropriate to situation  Mood Depressed  Aggressive Behavior  Effect No apparent injury  Thought Process  Coherency WDL  Content WDL  Delusions None reported or observed  Perception Hallucinations  Hallucination Auditory  Judgment Impaired  Confusion None  Danger to Self  Current suicidal ideation? Denies  Self-Injurious Behavior No self-injurious ideation or behavior indicators observed or expressed   Agreement Not to Harm Self Yes  Description of Agreement Verbal contract  Danger to Others  Danger to Others None reported or observed   D. Pt presents with a depressed affect/ mood but a little brighter today-has been friendly during interactions. Pt observed sitting in the hall on the phone making phone calls. Pt continues to endorse command AH, and some passive SI with no plan.    A. Labs and vitals monitored. Pt given and educated on medications. Pt supported emotionally and encouraged to express concerns and ask questions.   R. Pt remains safe with 15 minute checks. Will continue POC.

## 2021-06-13 NOTE — Group Note (Deleted)
Recreation Therapy Group Note   Group Topic:Stress Management  Group Date: 06/13/2021 Start Time: 0935 End Time: 0950 Facilitators: Kenji Mapel A, NT Location: [address] Dayroom       Affect/Mood: {RT BHH Affect/Mood:26271}   Participation Level: {RT BHH Participation Level:26267}   Participation Quality: {RT BHH Participation Quality:26268}   Behavior: {RT BHH Group Behavior:26269}   Speech/Thought Process: {RT BHH Speech/Thought:26276}   Insight: {RT BHH Insight:26272}   Judgement: {RT BHH Judgement:26278}   Modes of Intervention: {RT BHH Modes of Intervention:26277}   Patient Response to Interventions:  {RT BHH Patient Response to Intervention:26274}   Education Outcome:  {RT BHH Education Outcome:26279}   Clinical Observations/Individualized Feedback: *** was *** in their participation of session activities and group discussion. Pt identified ***   Plan: {RT BHH Tx Plan:26280}   Ruthy Forry A, NT,  06/13/2021 12:49 PM 

## 2021-06-13 NOTE — Group Note (Signed)
LCSW Group Therapy Note   Group Date: 06/13/2021 Start Time: 1300 End Time: 1400   Type of Therapy and Topic:  Group Therapy:   Participation Level:  Did Not Attend  Description of Group: In this process group, patients discussed using strengths to work toward goals and address challenges.  Patients identified two positive things about themselves and one goal they were working on.  Patients were given the opportunity to share openly and support each other's plan for self-empowerment.  The group discussed the value of gratitude and were encouraged to have a daily reflection of positive characteristics or circumstances.  Patients were encouraged to identify a plan to utilize their strengths to work on current challenges and goals.   Therapeutic Goals Patient will verbalize personal strengths/positive qualities and relate how these can assist with achieving desired personal goals Patients will verbalize affirmation of peers plans for personal change and goal setting Patients will explore the value of gratitude and positive focus as related to successful achievement of goals Patients will verbalize a plan for regular reinforcement of personal positive qualities and circumstances.   Summary of Patient Progress:  Did not attend      Therapeutic Modalities Cognitive Behavioral Therapy Motivational Interviewing  Aram Beecham, [place] 06/13/2021  2:02 PM

## 2021-06-13 NOTE — Progress Notes (Signed)
ANTICOAGULATION CONSULT NOTE - Follow Up Consult  Pharmacy Consult for Coumadin   Allergies  Allergen Reactions   Quetiapine Fumarate     Other reaction(s): Confusion   Ketorolac Tromethamine Rash   Lactose Intolerance (Gi) Diarrhea    PLabs: Recent Labs    06/11/21 0658 06/12/21 0635 06/13/21 0644  LABPROT 18.8* 21.0* 22.8*  INR 1.6* 1.8* 2.0*    Estimated Creatinine Clearance: 85.7 mL/min (by C-G formula based on SCr of 0.9 mg/dL).   Medications:  Scheduled:   multivitamin with minerals  1 tablet Oral Daily   nicotine  14 mg Transdermal Daily   OLANZapine  15 mg Oral QHS   OLANZapine  5 mg Oral Daily   pindolol  2.5 mg Oral BID   sertraline  75 mg Oral Daily   thiamine  100 mg Oral Daily   traZODone  200 mg Oral QHS   Warfarin - Pharmacist Dosing Inpatient   Does not apply q1600    Assessment: Inr at low end of goal.  Pt has f/u appt Thursday   Goal of Therapy:  INR 2-3    Plan:  Coumadin 7.5 X 2 on Monday and Tuesday, 5 mg on wed.  F/u dosing provided at outpt appointment on Thursday after recheck INR. Dc lovenox as INR now 2.0  Charyl Dancer 06/13/2021,9:26 AM

## 2021-06-13 NOTE — BHH Group Notes (Signed)
Pts didn't attend tonights wrap up group.     The focus of this group is to help patients review their daily goal of treatment and discuss progress on daily workbooks.

## 2021-06-14 ENCOUNTER — Other Ambulatory Visit (HOSPITAL_COMMUNITY): Payer: Self-pay

## 2021-06-14 DIAGNOSIS — F1020 Alcohol dependence, uncomplicated: Secondary | ICD-10-CM

## 2021-06-14 DIAGNOSIS — F1990 Other psychoactive substance use, unspecified, uncomplicated: Secondary | ICD-10-CM

## 2021-06-14 MED ORDER — HYDROXYZINE HCL 25 MG PO TABS
25.0000 mg | ORAL_TABLET | Freq: Three times a day (TID) | ORAL | 0 refills | Status: AC | PRN
  Filled 2021-06-14: qty 30, 10d supply, fill #0

## 2021-06-14 MED ORDER — TRAZODONE HCL 100 MG PO TABS
200.0000 mg | ORAL_TABLET | Freq: Every day | ORAL | 0 refills | Status: AC
  Filled 2021-06-14: qty 30, 15d supply, fill #0

## 2021-06-14 MED ORDER — OLANZAPINE 5 MG PO TABS
ORAL_TABLET | ORAL | 0 refills | Status: AC
  Filled 2021-06-14: qty 56, 14d supply, fill #0

## 2021-06-14 MED ORDER — NICOTINE 14 MG/24HR TD PT24
14.0000 mg | MEDICATED_PATCH | Freq: Every day | TRANSDERMAL | 0 refills | Status: AC
  Filled 2021-06-14: qty 28, 28d supply, fill #0

## 2021-06-14 MED ORDER — PINDOLOL 5 MG PO TABS
2.5000 mg | ORAL_TABLET | Freq: Two times a day (BID) | ORAL | 0 refills | Status: AC
  Filled 2021-06-14: qty 30, 30d supply, fill #0

## 2021-06-14 MED ORDER — WARFARIN SODIUM 5 MG PO TABS
5.0000 mg | ORAL_TABLET | Freq: Once | ORAL | 0 refills | Status: AC
  Filled 2021-06-14: qty 2, 2d supply, fill #0

## 2021-06-14 MED ORDER — WARFARIN SODIUM 7.5 MG PO TABS
7.5000 mg | ORAL_TABLET | Freq: Once | ORAL | Status: AC
  Administered 2021-06-14: 7.5 mg via ORAL
  Filled 2021-06-14: qty 1

## 2021-06-14 MED ORDER — SERTRALINE HCL 25 MG PO TABS
75.0000 mg | ORAL_TABLET | Freq: Every day | ORAL | 0 refills | Status: AC
  Filled 2021-06-14: qty 30, 10d supply, fill #0

## 2021-06-14 NOTE — Discharge Summary (Addendum)
Physician Discharge Summary Note  Patient:  Phillip Marquez is an 60 y.o., male MRN:  [id number] DOB:  1961-07-15 Patient phone:  [phone] (home)  Patient address:   [address] [facility] [address],  Total Time spent with patient: 15 minutes  Date of Admission:  06/02/2021 Date of Discharge: 06/14/2021  Reason for Admission:  Per admission assessment note: "This is the [facility] psychiatric [facility] in [facility] for this 59 year old Caucasian male with hx of Bipolar disorder, alcohol use disorder, 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 He has been a regular patient in another psychiatric hospital x multiple times all related to substance use & bipolar disorder symptoms. Chart is reviewed, the current lab results reviewed & the chart findings discussed with the treatment team. Phillip Marquez is admitted to the [facility] from the [facility] with complaints of worsening alcohol use, benzodiazepine use & suicidal ideations with plans to drive his car into a bridge. After evaluation at the ED, he was recommended for inpatient psychiatric admission for further mental health evaluation/detoxification treatments. His UDS was positive for benzodiazepine & has a BAL of <10. During this evaluation."  Evaluation at discharge: Phillip Marquez 60 year old Caucasian male presents due to worsening depression, suicidal ideations, anxiety and auditory hallucinations.  He was seen and evaluated by this nurse practitioner and attending psychiatrist [place].   On the day of discharge, he reports that his mood is less depressed since his admission.  Reports anxiety is less.  He reports sleep is better and racing thoughts are less. He denied having suicidal thoughts.  Denied having any suicide intent or plan.  Denies having homicidal thoughts. He did report having command auditory hallucinations, which are chronic, but he reports that these are less intense, less frequent and less loud since admission.  He  reports that he is able to continue to not act on these command auditory hallucinations, as he has not done so in the hospital, and as he does not have any more or desire to act on these hallucinations. He reports these hallucinations are unwelcome in his head and ego dystonic.   He denies having any ego syntonic thoughts to harm himself or others.   Denies that command auditory hallucinations to harm to harm anyone else.  [name] initial reported that he has follow-up with [name].  CSW verified acceptance. Patient was was not excepted to [name].  He will be referred to [name] recovery services.   Patient to follow-up with INR, CMP, CBC with primary care provider and/or [place] awareness on 06/17/2021.  Support ,encouragement and  reassurance was provided.     Principal Problem: Bipolar depression (HCC) Discharge Diagnoses: Principal Problem:   Bipolar depression (HCC) Active Problems:   Alcohol use disorder, severe, dependence (HCC)   Severe benzodiazepine use disorder (HCC)   Past Psychiatric History:   Past Medical History: History reviewed. No pertinent past medical history. History reviewed. No pertinent surgical history. Family History: History reviewed. No pertinent family history. Family Psychiatric  History:  Social History:  Social History   Substance and Sexual Activity  Alcohol Use None     Social History   Substance and Sexual Activity  Drug Use Not on file    Social History   Socioeconomic History   Marital status: Divorced    Spouse name: Not on file   Number of children: Not on file   Years of education: Not on file   Highest education level: Not on file  Occupational History   Not on file  Tobacco Use   Smoking status: Every Day    Packs/day: 1.00    Years: 10.00    Pack years: 10.00    Types: Cigarettes   Smokeless tobacco: Never  Substance and Sexual Activity   Alcohol use: Not on file   Drug use: Not on file   Sexual activity: Not  on file  Other Topics Concern   Not on file  Social History Narrative   Not on file   Social Determinants of Health   Financial Resource Strain: Not on file  Food Insecurity: Not on file  Transportation Needs: Not on file  Physical Activity: Not on file  Stress: Not on file  Social Connections: Not on file    Hospital Course:  [name] was admitted for Bipolar depression [facility])  and crisis management.  Pt was treated discharged with the medications listed below under Medication List.  Medical problems were identified and treated as needed.  Home medications were restarted as appropriate.  Improvement was monitored by observation and Phillip Marquez 's daily report of symptom reduction.  Emotional and mental status was monitored by daily self-inventory reports completed by Phillip Marquez and clinical staff.         [name] was evaluated by the treatment team for stability and plans for continued recovery upon discharge. Phillip Marquez 's motivation was an integral factor for scheduling further treatment. Employment, transportation, bed availability, health status, family support, and any pending legal issues were also considered during hospital stay. Pt was offered further treatment options upon discharge including but not limited to Residential, Intensive Outpatient, and Outpatient treatment.  [name] will follow up with the services as listed below under Follow Up Information.     Upon completion of this admission the patient was both mentally and medically stable for discharge. He continues to endorse suicidal ideations.  Denies plan or intent.  Reports chronic auditory hallucinations.  Patient to keep all follow-up appointment with outpatient providers.  Phillip Marquez responded well to treatment with Zyprexa 15 mg and Zyprexa 5 mg and Pindolol 2.5 mg and Trazodone 50 mg and Zoloft 75 mg without adverse effects Pt demonstrated improvement without reported or observed adverse effects to  the point of stability appropriate for outpatient management. Pertinent labs include : INR: 2.0, Lipid Panel 273 cholesterol. for which outpatient follow-up is necessary for lab recheck as mentioned below. Reviewed CBC, CMP, BAL, and UDS; all unremarkable aside from noted exceptions.    Physical Findings: AIMS: Facial and Oral Movements Muscles of Facial Expression: None, normal Lips and Perioral Area: None, normal Jaw: None, normal Tongue: None, normal,Extremity Movements Upper (arms, wrists, hands, fingers): None, normal Lower (legs, knees, ankles, toes): None, normal, Trunk Movements Neck, shoulders, hips: None, normal, Overall Severity Severity of abnormal movements (highest score from questions above): None, normal Incapacitation due to abnormal movements: None, normal Patient's awareness of abnormal movements (rate only patient's report): No Awareness, Dental Status Current problems with teeth and/or dentures?: No Does patient usually wear dentures?: No  AIMS score today is 0.  CIWA:  CIWA-Ar Total: 0 COWS:     Musculoskeletal: Strength & Muscle Tone: within normal limits Gait & Station: normal Patient leans: N/A   Psychiatric Specialty Exam:  Presentation  General Appearance: Appropriate for Environment; Casual; Fairly Groomed  Eye Contact:Good  Speech:Clear and Coherent; Normal Rate  Speech Volume:Normal  Handedness:Right   Mood and Affect  Mood:Anxious; Euthymic  Affect:Congruent; Constricted  Thought Process  Thought Processes:Coherent; Goal Directed; Linear  Descriptions of Associations:Intact  Orientation:Full (Time, Place and Person)  Thought Content:Logical  History of Schizophrenia/Schizoaffective disorder:No data recorded Duration of Psychotic Symptoms:No data recorded Hallucinations:Hallucinations: Auditory; Command Description of Command Hallucinations: Patient reports having auditory hallucinations, which tell him to harm himself.  He  reports the volume and intensity of these auditory hallucinations have decreased since admission to the psychiatric unit.  He reports being able to resist the commands of these auditory hallucinations throughout the hospitalization, and reports that he is able to resist the commands and auditory hallucinations because he does not organically or he does not ego syntonically have thoughts, desires, or wants to harm himself.  He denies having command auditory hallucinations to harm others.  Ideas of Reference:None  Suicidal Thoughts:Suicidal Thoughts: No  Homicidal Thoughts:Homicidal Thoughts: No   Sensorium  Memory:Immediate Good; Recent Good; Remote Good  Judgment:Fair  Insight:Fair   Executive Functions  Concentration:Fair  Attention Span:Fair  Recall:Fair  Fund of Knowledge:Fair  Language:Fair   Psychomotor Activity  Psychomotor Activity:Psychomotor Activity: Normal   Assets  Assets:Communication Skills; Desire for Improvement; Resilience; Social Support   Sleep  Sleep:Sleep: Fair    Physical Exam: Physical Exam Vitals and nursing note reviewed.  Cardiovascular:     Rate and Rhythm: Normal rate.     Pulses: Normal pulses.  Pulmonary:     Effort: Pulmonary effort is normal.  Neurological:     Mental Status: He is alert and oriented to person, place, and time.  Psychiatric:        Attention and Perception: Attention and perception normal.        Mood and Affect: Mood normal.        Speech: Speech normal.        Behavior: Behavior normal.        Thought Content: Thought content normal.        Cognition and Memory: Cognition normal.        Judgment: Judgment normal.   Review of Systems  HENT: Negative.    Eyes: Negative.   Cardiovascular: Negative.   Genitourinary: Negative.   Psychiatric/Behavioral:  Positive for depression and suicidal ideas. The patient is nervous/anxious.   All other systems reviewed and are negative. Blood pressure 120/78, pulse  62, temperature 98.8 F (37.1 C), temperature source Oral, resp. rate 20, height 6' (1.829 m), weight 70.3 kg, SpO2 98 %. Body mass index is 21.02 kg/m.   Social History   Tobacco Use  Smoking Status Every Day   Packs/day: 1.00   Years: 10.00   Pack years: 10.00   Types: Cigarettes  Smokeless Tobacco Never   Tobacco Cessation:  A prescription for an FDA-approved tobacco cessation medication provided at discharge   Blood Alcohol level:  Lab Results  Component Value Date   ETH <10 05/31/2021    Metabolic Disorder Labs:  Lab Results  Component Value Date   HGBA1C 4.8 06/04/2021   MPG 91.06 06/04/2021   No results found for: PROLACTIN Lab Results  Component Value Date   CHOL 273 (H) 06/04/2021   TRIG 116 06/04/2021   HDL 38 (L) 06/04/2021   CHOLHDL 7.2 06/04/2021   VLDL 23 06/04/2021   LDLCALC 212 (H) 06/04/2021    See Psychiatric Specialty Exam and Suicide Risk Assessment completed by Attending Physician prior to discharge.  Discharge destination:  Home  Is patient on multiple antipsychotic therapies at discharge:  No   Has Patient had three or more failed trials  of antipsychotic monotherapy by history:  No  Recommended Plan for Multiple Antipsychotic Therapies: NA  Discharge Instructions     Diet - low sodium heart healthy   Complete by: As directed    Increase activity slowly   Complete by: As directed       Allergies as of 06/14/2021       Reactions   Quetiapine Fumarate    Other reaction(s): Confusion   Ketorolac Tromethamine Rash   Lactose Intolerance (gi) Diarrhea        Medication List     STOP taking these medications    acetaminophen 500 MG tablet Commonly known as: TYLENOL       TAKE these medications      Indication  hydrOXYzine 25 MG tablet Commonly known as: ATARAX/VISTARIL Take 1 tablet (25 mg total) by mouth 3 (three) times daily as needed for anxiety.  Indication: Feeling Anxious   nicotine 14 mg/24hr  patch Commonly known as: NICODERM CQ - dosed in mg/24 hours Place 1 patch (14 mg total) onto the skin daily. Start taking on: June 15, 2021  Indication: Nicotine Addiction   OLANZapine 5 MG tablet Commonly known as: ZYPREXA Take 1 tablet (5 mg total) by mouth daily AND 3 tablets (15 mg total) at bedtime.  Indication: Agitation   pindolol 5 MG tablet Commonly known as: VISKEN Take 0.5 tablets (2.5 mg total) by mouth 2 (two) times daily.  Indication: Depression   sertraline 25 MG tablet Commonly known as: ZOLOFT Take 3 tablets (75 mg total) by mouth daily. Start taking on: June 15, 2021  Indication: Major Depressive Disorder   traZODone 100 MG tablet Commonly known as: DESYREL Take 2 tablets (200 mg total) by mouth at bedtime.  Indication: Trouble Sleeping   warfarin 5 MG tablet Commonly known as: COUMADIN Take 1 tablet (5 mg total) by mouth once for 1 dose. 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 Go on 06/15/2021.   Why: You have a hospital follow up appointment for substance abuse intensive therapy services, and medication management services on 06/15/21 (walk in between 8 am and 2:00 pm)    Address:  [address] [place], [place]. Contact information: [address] [place] [address] [phone]         [facility]. Call.   Why: A referral has been made to this provider for residential treatment services.  Please call to check on bed availability each day if you wish to pursue. Contact information: [address] [place] [address] [phone]         [facility]. Go on 06/16/2021.   Why: You have an appointment to establish care with this provider for primary care and coumadin therapy services, on 06/16/21 at 10:15 am.  This appointment  will be held in person. Contact information: [address] [place] [address] [phone]                Follow-up recommendations:  Activity:  as tolerated Diet:  heart healthy  Comments:  Take all medications as prescribed. Keep all follow-up appointments as scheduled.  Do not consume alcohol or use illegal drugs while on prescription medications. Report any adverse effects from your medications to your primary care provider promptly.  In the  event of recurrent symptoms or worsening symptoms, call 911, a crisis hotline, or go to the nearest emergency department for evaluation.    Signed: Oneta Rack, NP 06/14/2021, 11:14 AM   Total Time Spent in Direct Patient Care:  I personally spent 45 minutes on the unit in direct patient care. The direct patient care time included face-to-face time with the patient, reviewing the patient's chart, communicating with other professionals, and coordinating care. Greater than 50% of this time was spent in counseling or coordinating care with the patient regarding goals of hospitalization, psycho-education, and discharge planning needs.  On my assessment the patient denied SI, HI, AVH, paranoia, ideas of reference, or first rank symptoms on day of discharge. Patient denied drug cravings or active signs of withdrawal. Patient denied medication side-effects. Patient was not deemed to be a danger to self or others on day of discharge and was in agreement with discharge plans.   I have independently evaluated the patient during a face-to-face assessment on 06/14/21. I reviewed the patient's chart, and I participated in key portions of the service. I discussed the case with the APP, and I agree with the assessment and plan of care as documented in the APP's note, as addended by me or notated below:  I have directly added in the content of this note above.  Phineas Inches, MD Psychiatrist

## 2021-06-14 NOTE — Progress Notes (Signed)
RN met with pt and reviewed pt's discharge instructions. Pt verbalized understanding of discharge instructions and pt did not have any questions. RN reviewed and provided pt with a copy of SRA, AVS and Transition Record. RN returned pt's belongings to pt.  Medication samples were given to pt. Pt denied SI/HI/AVH and voiced no concerns. Pt was appreciative of the care pt received at [facility]. Patient discharged to the lobby without incident.  ?

## 2021-06-14 NOTE — [facility] Counselor (Signed)
CSW called and spoke with [facility] who stated that pt was denied 06/13/21 at 11:36am.   Fredirick Lathe, LCSWA Clinicial Social Worker [employer]

## 2021-06-14 NOTE — Group Note (Signed)
Group Topic: Goal Setting  Group Date: 06/14/2021 Start Time: 0830 End Time: 0900 Facilitators: Margaret Pyle, NT  [facility]  Number of Participants: 14  Group Focus: activities of daily living skills and goals/reality orientation  Name: Phillip Marquez Date of Birth: [birth year]  MR: [id number]    Level of Participation:  Pt did not attend

## 2021-06-14 NOTE — [facility] Suicide Risk Assessment (Signed)
[facility] Discharge Suicide Risk Assessment   Principal Problem: Bipolar depression (HCC) Discharge Diagnoses: Principal Problem:   Bipolar depression (HCC) Active Problems:   Alcohol use disorder, severe, dependence (HCC)   Severe benzodiazepine use disorder (HCC)   Total Time spent with patient: 30 minutes  Patient is a 60 year old male with a past psychiatric history of bipolar disorder, alcohol use disorder, and benzodiazepine use disorder, who was admitted to psychiatric hospital with suicide ideation with plan and intent to drive into a bridge. Patient reports stopping psychiatric medications, Depakote and Lamictal, about 3 months ago.   During the patient's hospitalization, patient had extensive initial psychiatric evaluation, and follow-up psychiatric evaluations every day.  Internal medicine was also consulted for recommendations on treatment of left ventricular thrombus.  Patient's psychiatric medications were adjusted on admission:  Patient was started on the CIWA for alcohol and benzodiazepine withdrawal Stop Cymbalta Start Prozac 10 mg once daily, for depressive symptoms.  Titrate to 20 mg over the weekend. Start Zyprexa 5 mg nightly, for bipolar depression Change trazodone from 100 mg at bedtime schedule, to 50 mg nightly as needed Start Haldol protocol for agitation  During the hospitalization, other adjustments were made to the patient's psychiatric medication regimen:  CIWA taper was completed. Haldol for agitation was stopped Prozac was changed to sertraline, as it was decided to start warfarin for thrombus Zyprexa was increased to current dose at time of discharge.  Trazodone was increased to current dose at time of discharge.   Pindolol 2.5 mg twice daily was started for anxiety Lovenox to warfarin bridge occurred during hospitalization, as does by the pharmacist.  There was extensive discussion prior to the prescription, with the risks and benefits of anticoagulation  therapy, including risk of severe life-threatening bleeding, including stroke or death, if the patient were to fall. The patient voiced understanding of this risk, provided verbal consent for the anticoagulation therapy.  Patient's care was discussed during the interdisciplinary team meeting every day during the hospitalization.  The patient denied having side effects to prescribed psychiatric medication.  The patient reports their target psychiatric symptoms of depression and suicidal thoughts, responded well to the psychiatric medications.  He reports that command auditory hallucinations became less loud, less frequent, and less intense during hospitalization, and that he was able to not act on the demands of the auditory hallucinations throughout the hospitalization The patient reports overall benefit other psychiatric hospitalization.  On the day of discharge, the patient clearly reminded and educated about the importance of following up with his appointment on October 27, for management of anticoagulation therapy.  Labs were reviewed with the patient, and abnormal results were discussed with the patient.  The patient denied having suicidal thoughts more than 48 hours prior to discharge.  Patient denies having homicidal thoughts.Patient denies any visual hallucinations.  Patient denies having paranoid thoughts.  The patient is able to verbalize their individual safety plan to this provider.  It is recommended to the patient to continue psychiatric medications as prescribed, after discharge from the hospital.    It is recommended to the patient to follow up with your outpatient psychiatric provider and PCP. Patient is educated and encouraged to follow up with provider who manages anticoagulation therapy, with an appointment on October 27, as detailed in the discharge summary.  Discussed with the patient, the impact of alcohol, drugs, tobacco have been there overall psychiatric and medical  wellbeing, and abstaining from substance use was recommended the patient.    Musculoskeletal: Strength &  Muscle Tone: within normal limits Gait & Station: normal Patient leans: N/A  Psychiatric Specialty Exam  Presentation  General Appearance: Appropriate for Environment; Casual; Fairly Groomed  Eye Contact:Good  Speech:Clear and Coherent; Normal Rate  Speech Volume:Normal  Handedness:Right   Mood and Affect  Mood:Anxious; Euthymic  Duration of Depression Symptoms: Less than two weeks  Affect:Congruent; Constricted   Thought Process  Thought Processes:Coherent; Goal Directed; Linear  Descriptions of Associations:Intact  Orientation:Full (Time, Place and Person)  Thought Content:Logical  History of Schizophrenia/Schizoaffective disorder:No data recorded Duration of Psychotic Symptoms:No data recorded Hallucinations:Hallucinations: Auditory; Command Description of Command Hallucinations: Patient reports having auditory hallucinations, which tell him to harm himself.  He reports the volume and intensity of these auditory hallucinations have decreased since admission to the psychiatric unit.  He reports being able to resist the commands of these auditory hallucinations throughout the hospitalization, and reports that he is able to resist the commands and auditory hallucinations because he does not organically or he does not ego syntonically have thoughts, desires, or wants to harm himself.  He denies having command auditory hallucinations to harm others.  Ideas of Reference:None  Suicidal Thoughts:Suicidal Thoughts: No  Homicidal Thoughts:Homicidal Thoughts: No   Sensorium  Memory:Immediate Good; Recent Good; Remote Good  Judgment:Fair  Insight:Fair   Executive Functions  Concentration:Fair  Attention Span:Fair  Recall:Fair  Fund of Knowledge:Fair  Language:Fair   Psychomotor Activity  Psychomotor Activity:Psychomotor Activity: Normal   Assets   Assets:Communication Skills; Desire for Improvement; Resilience; Social Support   Sleep  Sleep:Sleep: Fair   Physical Exam: Physical Exam see discharge summary ROS see discharge summary  Blood pressure 120/78, pulse 62, temperature 98.8 F (37.1 C), temperature source Oral, resp. rate 20, height 6' (1.829 m), weight 70.3 kg, SpO2 98 %. Body mass index is 21.02 kg/m.  Mental Status Per Nursing Assessment::   On Admission:  Suicidal ideation indicated by patient, Suicide plan  Demographic factors:  Male, Caucasian Loss Factors:  Decline in physical health Historical Factors:  Family history of mental illness or substance abuse  Risk Reduction Factors:   Positive therapeutic relationship and Positive coping skills or problem solving skills  Continued Clinical Symptoms:  Severe Anxiety and/or Agitation Bipolar Disorder:   Depressive phase  Cognitive Features That Contribute To Risk:  None    Suicide Risk:  Mild:  There are no identifiable suicide plans, no associated intent, mild dysphoria and related symptoms, good self-control (both objective and subjective assessment), few other risk factors, and identifiable protective factors, including available and accessible social support.   Follow-up Information     [facility]. Go on 06/15/2021.   Why: You have a hospital follow up appointment for substance abuse intensive therapy services, and medication management services on 06/15/21 (walk in between 8 am and 2:00 pm)    Address:  [address] [place], [place]. Contact information: [address] [place] [address] [phone]         [facility]. Call.   Why: A referral has been made to this provider for residential treatment services.  Please call to check on bed availability each day if you wish to pursue. Contact information: [address] [place] [address] [phone]          [facility]. Go on 06/16/2021.   Why: You have an appointment to establish care with this provider for primary care and coumadin therapy services, on 06/16/21  at 10:15 am.  This appointment will be held in person. Contact information: [address] [place] [address] [phone]                Plan Of Care/Follow-up recommendations:   Activity: as tolerated  Diet: heart healthy  Other: -Follow-up with your outpatient psychiatric provider -instructions on appointment date, time, and address (location) are provided to you in discharge paperwork. -Take your psychiatric medications as prescribed at discharge - instructions are provided to you in the discharge paperwork -Follow-up with outpatient primary care doctor and other specialists -for management of chronic medical disease, including:  Cardiomyopathy Left ventricular thrombus Anticoagulation therapy Alcohol use disorder Benzodiazepine use disorder  -Testing: Follow-up with outpatient provider for abnormal lab results:  Follow-up with outpatient clinic for management of anticoagulation therapy.  Appointment is on October 27, as detailed in the discharge summary Abnormal lipid panel results.  Normal HbA1c results.  -Recommend abstinence from alcohol, tobacco, and other illicit drug use at discharge.   -If your psychiatric symptoms recur, worsening, or if you have side effects to your psychiatric medications, call your outpatient psychiatric provider, 911, [phone] or go to the nearest emergency department.  -If suicidal thoughts recur, call your outpatient psychiatric provider, 911, [phone] or go to the nearest emergency department.   Cristy Hilts, MD 06/14/2021, 10:58 AM

## 2021-06-14 NOTE — Progress Notes (Signed)
Pt visible on the unit this evening , pt not given Visken due to pt pulse 58 , pt stated it was for anxiety, so pt was offered Vistaril, but pt refused and stated he would try to lay down without it.     06/14/21 0100  Psych Admission Type (Psych Patients Only)  Admission Status Voluntary  Psychosocial Assessment  Patient Complaints Depression  Eye Contact Fair  Facial Expression Flat  Affect Appropriate to circumstance  Speech Logical/coherent  Interaction Minimal  Motor Activity Slow  Appearance/Hygiene Unremarkable  Behavior Characteristics Cooperative  Mood Depressed  Aggressive Behavior  Effect No apparent injury  Thought Process  Coherency WDL  Content WDL  Delusions None reported or observed  Perception Hallucinations  Hallucination Auditory  Judgment Impaired  Confusion None  Danger to Self  Current suicidal ideation? Denies  Self-Injurious Behavior No self-injurious ideation or behavior indicators observed or expressed   Agreement Not to Harm Self Yes  Description of Agreement Verbal contract  Danger to Others  Danger to Others None reported or observed

## 2021-06-14 NOTE — Progress Notes (Signed)
  [facility] Adult Case Management Discharge Plan :  Will you be returning to the same living situation after discharge:  Yes,  friend's home At discharge, do you have transportation home?: Yes,  [employer] [facility] Do you have the ability to pay for your medications: No.  Release of information consent forms completed and in the chart;  Patient's signature needed at discharge.  Patient to Follow up at:  Follow-up Information     [facility]. Go on 06/15/2021.   Why: You have a hospital follow up appointment for substance abuse intensive therapy services, and medication management services on 06/15/21 (walk in between 8 am and 2:00 pm)    Address:  [address] [place], [place]. Contact information: [address] [place] [address] [phone]         [facility]. Call.   Why: A referral has been made to this provider for residential treatment services.  Please call to check on bed availability each day if you wish to pursue. Contact information: [address] [place] [address] [phone]         [facility]. Go on 06/16/2021.   Why: You have an appointment to establish care with this provider for primary care and coumadin therapy services, on 06/16/21 at 10:15 am.  This appointment will be held in person. Contact information: [address] [place] [address] [phone]                Next level of care provider has access to [facility] Link:yes  Safety Planning and Suicide Prevention discussed: Yes,  w/ pt     Has patient been referred to the Quitline?: Patient refused referral  Patient has been referred for addiction treatment: Yes  Felizardo Hoffmann, LCSWA 06/14/2021, 10:46 AM

## 2021-06-15 NOTE — Progress Notes (Deleted)
Patient ID: Phillip Marquez, male   DOB: 05-03-1961, 60 y.o.   MRN: [id number] Reason for Admission:  Per admission assessment note: "This is the first psychiatric admission in this [facility] for this 60 year old Caucasian male with hx of Bipolar disorder, alcohol use disorder, 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 He has been a regular patient in another psychiatric hospital x multiple times all related to substance use & bipolar disorder symptoms. Chart is reviewed, the current lab results reviewed & the chart findings discussed with the treatment team. Phillip Marquez is admitted to the [facility] from the [facility] with complaints of worsening alcohol use, benzodiazepine use & suicidal ideations with plans to drive his car into a bridge. After evaluation at the ED, he was recommended for inpatient psychiatric admission for further mental health evaluation/detoxification treatments. His UDS was positive for benzodiazepine & has a BAL of <10. During this evaluation."   Evaluation at discharge: Phillip Marquez 60 year old Caucasian male presents due to worsening depression, suicidal ideations, anxiety and auditory hallucinations.  He was seen and evaluated by this nurse practitioner and attending psychiatrist [place].   On the day of discharge, he reports that his mood is less depressed since his admission.  Reports anxiety is less.  He reports sleep is better and racing thoughts are less. He denied having suicidal thoughts.  Denied having any suicide intent or plan.  Denies having homicidal thoughts. He did report having command auditory hallucinations, which are chronic, but he reports that these are less intense, less frequent and less loud since admission.  He reports that he is able to continue to not act on these command auditory hallucinations, as he has not done so in the hospital, and as he does not have any more or desire to act on these hallucinations. He reports these hallucinations are unwelcome in  his head and ego dystonic.   He denies having any ego syntonic thoughts to harm himself or others.   Denies that command auditory hallucinations to harm to harm anyone else.   [place] initial reported that he has follow-up with [name].  CSW verified acceptance. Patient was was not excepted to [name].  He will be referred to [name] recovery services.   Patient to follow-up with INR, CMP, CBC with primary care provider and/or [place] awareness on 06/17/2021.  Support ,encouragement and  reassurance was provided.

## 2021-06-16 ENCOUNTER — Inpatient Hospital Stay: Payer: Self-pay | Admitting: Physician Assistant

## 2021-08-25 ENCOUNTER — Other Ambulatory Visit (HOSPITAL_COMMUNITY): Payer: Self-pay
